# Patient Record
Sex: Male | Born: 1955 | Race: Black or African American | Hispanic: No | Marital: Single | Smoking: Current some day smoker
Health system: Southern US, Community
[De-identification: ages and names within clinical notes are randomized; demographics above are authoritative.]

## PROBLEM LIST (undated history)

## (undated) DIAGNOSIS — D696 Thrombocytopenia, unspecified: Secondary | ICD-10-CM

## (undated) DIAGNOSIS — Z789 Other specified health status: Secondary | ICD-10-CM

## (undated) HISTORY — PX: HAND SURGERY: SHX662

## (undated) HISTORY — DX: Thrombocytopenia, unspecified: D69.6

---

## 2005-03-19 ENCOUNTER — Other Ambulatory Visit: Payer: Self-pay

## 2005-03-19 ENCOUNTER — Emergency Department: Payer: Self-pay | Admitting: Unknown Physician Specialty

## 2008-05-15 ENCOUNTER — Emergency Department: Payer: Self-pay | Admitting: Emergency Medicine

## 2008-09-23 ENCOUNTER — Emergency Department (HOSPITAL_COMMUNITY): Admission: EM | Admit: 2008-09-23 | Discharge: 2008-09-23 | Payer: Self-pay | Admitting: Emergency Medicine

## 2009-03-25 ENCOUNTER — Emergency Department: Payer: Self-pay | Admitting: Emergency Medicine

## 2010-10-24 ENCOUNTER — Emergency Department: Payer: Self-pay | Admitting: *Deleted

## 2013-09-17 ENCOUNTER — Emergency Department: Payer: Self-pay | Admitting: Emergency Medicine

## 2013-09-17 LAB — COMPREHENSIVE METABOLIC PANEL
ALT: 31 U/L (ref 12–78)
ANION GAP: 10 (ref 7–16)
Albumin: 3.5 g/dL (ref 3.4–5.0)
Alkaline Phosphatase: 48 U/L
BILIRUBIN TOTAL: 0.4 mg/dL (ref 0.2–1.0)
BUN: 10 mg/dL (ref 7–18)
CREATININE: 0.88 mg/dL (ref 0.60–1.30)
Calcium, Total: 7.9 mg/dL — ABNORMAL LOW (ref 8.5–10.1)
Chloride: 108 mmol/L — ABNORMAL HIGH (ref 98–107)
Co2: 23 mmol/L (ref 21–32)
GLUCOSE: 98 mg/dL (ref 65–99)
OSMOLALITY: 280 (ref 275–301)
POTASSIUM: 3.8 mmol/L (ref 3.5–5.1)
SGOT(AST): 32 U/L (ref 15–37)
Sodium: 141 mmol/L (ref 136–145)
Total Protein: 7.2 g/dL (ref 6.4–8.2)

## 2013-09-17 LAB — PROTIME-INR
INR: 1
Prothrombin Time: 13.1 secs (ref 11.5–14.7)

## 2013-09-17 LAB — CBC
HCT: 47.3 % (ref 40.0–52.0)
HGB: 15.2 g/dL (ref 13.0–18.0)
MCH: 30.3 pg (ref 26.0–34.0)
MCHC: 32.2 g/dL (ref 32.0–36.0)
MCV: 94 fL (ref 80–100)
PLATELETS: 144 10*3/uL — AB (ref 150–440)
RBC: 5.02 10*6/uL (ref 4.40–5.90)
RDW: 14.7 % — AB (ref 11.5–14.5)
WBC: 7.7 10*3/uL (ref 3.8–10.6)

## 2015-10-17 ENCOUNTER — Emergency Department: Payer: No Typology Code available for payment source

## 2015-10-17 ENCOUNTER — Emergency Department
Admission: EM | Admit: 2015-10-17 | Discharge: 2015-10-17 | Disposition: A | Discharge status: Home / Self Care | Payer: No Typology Code available for payment source | Attending: Emergency Medicine | Admitting: Emergency Medicine

## 2015-10-17 ENCOUNTER — Encounter: Payer: Self-pay | Admitting: *Deleted

## 2015-10-17 ENCOUNTER — Other Ambulatory Visit: Payer: Self-pay | Admitting: Radiology

## 2015-10-17 DIAGNOSIS — S50812A Abrasion of left forearm, initial encounter: Secondary | ICD-10-CM | POA: Diagnosis not present

## 2015-10-17 DIAGNOSIS — F17210 Nicotine dependence, cigarettes, uncomplicated: Secondary | ICD-10-CM | POA: Insufficient documentation

## 2015-10-17 DIAGNOSIS — Y9355 Activity, bike riding: Secondary | ICD-10-CM | POA: Diagnosis not present

## 2015-10-17 DIAGNOSIS — S40212A Abrasion of left shoulder, initial encounter: Secondary | ICD-10-CM | POA: Insufficient documentation

## 2015-10-17 DIAGNOSIS — S30810A Abrasion of lower back and pelvis, initial encounter: Secondary | ICD-10-CM | POA: Diagnosis not present

## 2015-10-17 DIAGNOSIS — Y92410 Unspecified street and highway as the place of occurrence of the external cause: Secondary | ICD-10-CM | POA: Insufficient documentation

## 2015-10-17 DIAGNOSIS — M545 Low back pain: Secondary | ICD-10-CM | POA: Diagnosis present

## 2015-10-17 DIAGNOSIS — Y990 Civilian activity done for income or pay: Secondary | ICD-10-CM | POA: Insufficient documentation
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
## 2015-10-17 NOTE — ED Notes (Signed)
Patient transported to X-ray 
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
## 2015-11-10 MED ORDER — PIPERACILLIN-TAZOBACTAM 3.375 G IVPB
INTRAVENOUS | Status: AC
  Filled 2015-11-10: qty 50

## 2015-11-10 MED ORDER — VANCOMYCIN HCL IN DEXTROSE 1-5 GM/200ML-% IV SOLN
INTRAVENOUS | Status: AC
  Filled 2015-11-10: qty 200
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
## 2020-05-04 ENCOUNTER — Ambulatory Visit (LOCAL_COMMUNITY_HEALTH_CENTER): Payer: Self-pay

## 2020-05-04 ENCOUNTER — Other Ambulatory Visit: Payer: Self-pay

## 2020-05-04 DIAGNOSIS — Z111 Encounter for screening for respiratory tuberculosis: Secondary | ICD-10-CM

## 2020-05-07 ENCOUNTER — Ambulatory Visit (LOCAL_COMMUNITY_HEALTH_CENTER): Payer: Self-pay

## 2020-05-07 ENCOUNTER — Other Ambulatory Visit: Payer: Self-pay

## 2020-05-07 DIAGNOSIS — Z111 Encounter for screening for respiratory tuberculosis: Secondary | ICD-10-CM

## 2020-05-07 LAB — TB SKIN TEST
Induration: 0 mm
TB Skin Test: NEGATIVE

## 2021-08-09 DIAGNOSIS — R030 Elevated blood-pressure reading, without diagnosis of hypertension: Secondary | ICD-10-CM | POA: Diagnosis not present

## 2021-08-09 DIAGNOSIS — Z720 Tobacco use: Secondary | ICD-10-CM | POA: Diagnosis not present

## 2022-06-24 ENCOUNTER — Other Ambulatory Visit: Payer: Self-pay

## 2022-06-24 ENCOUNTER — Emergency Department
Admission: EM | Admit: 2022-06-24 | Discharge: 2022-06-24 | Disposition: A | Discharge status: Home / Self Care | Payer: Medicare HMO | Attending: Emergency Medicine | Admitting: Emergency Medicine

## 2022-06-24 DIAGNOSIS — Z20822 Contact with and (suspected) exposure to covid-19: Secondary | ICD-10-CM | POA: Insufficient documentation

## 2022-06-24 DIAGNOSIS — J029 Acute pharyngitis, unspecified: Secondary | ICD-10-CM | POA: Diagnosis present

## 2022-06-24 DIAGNOSIS — R001 Bradycardia, unspecified: Secondary | ICD-10-CM | POA: Diagnosis not present

## 2022-06-24 DIAGNOSIS — R070 Pain in throat: Secondary | ICD-10-CM | POA: Diagnosis not present

## 2022-06-24 DIAGNOSIS — J020 Streptococcal pharyngitis: Secondary | ICD-10-CM | POA: Diagnosis not present

## 2022-06-24 DIAGNOSIS — R059 Cough, unspecified: Secondary | ICD-10-CM | POA: Diagnosis not present

## 2022-06-24 LAB — GROUP A STREP BY PCR: Group A Strep by PCR: DETECTED — AB

## 2022-06-24 LAB — SARS CORONAVIRUS 2 BY RT PCR: SARS Coronavirus 2 by RT PCR: NEGATIVE

## 2022-06-24 MED ORDER — DEXAMETHASONE SODIUM PHOSPHATE 10 MG/ML IJ SOLN
10.0000 mg | Freq: Once | INTRAMUSCULAR | Status: AC
  Administered 2022-06-24: 10 mg via INTRAMUSCULAR
  Filled 2022-06-24: qty 1

## 2022-06-24 MED ORDER — KETOROLAC TROMETHAMINE 15 MG/ML IJ SOLN
15.0000 mg | Freq: Once | INTRAMUSCULAR | Status: AC
  Administered 2022-06-24: 15 mg via INTRAMUSCULAR
  Filled 2022-06-24: qty 1

## 2022-06-24 MED ORDER — AMOXICILLIN-POT CLAVULANATE 875-125 MG PO TABS: 1.0000 | ORAL_TABLET | Freq: Two times a day (BID) | ORAL | 0 refills | Status: AC

## 2022-06-24 NOTE — ED Notes (Signed)
Pt walked out of dept. RN unable to give pt his d/c papers and prescription. RN attempted to call pt to advise him about Rx. Pt did not answer.

## 2022-06-24 NOTE — ED Provider Notes (Signed)
[facility] Provider Note    Event Date/Time   First MD Initiated Contact with Patient 06/24/22 1727     (approximate)   History   Sore Throat   HPI  Spencer Berg is a 67 y.o. male who presents today for evaluation of sore throat that began today.  Patient reports that he has pain with swallowing, though he is able to swallow.  He reports that he is aware scratchy voice, though no fullness to his voice or hot potato voice.  He is unsure of any sick contacts.  He is unsure if he has had a fever.  He also reports that he has had a cough that began today as well.  He denies cigarette smoking.  He denies neck stiffness.  There are no problems to display for this patient.         Physical Exam   Triage Vital Signs: ED Triage Vitals  Enc Vitals Group     BP 06/24/22 1703 (!) 115/104     Pulse Rate 06/24/22 1703 (!) 102     Resp 06/24/22 1703 18     Temp 06/24/22 1703 100.2 F (37.9 C)     Temp Source 06/24/22 1703 Oral     SpO2 06/24/22 1703 96 %     Weight --      Height --      Head Circumference --      Peak Flow --      Pain Score 06/24/22 1701 4     Pain Loc --      Pain Edu? --      Excl. in GC? --     Most recent vital signs: Vitals:   06/24/22 1703  BP: (!) 115/104  Pulse: (!) 102  Resp: 18  Temp: 100.2 F (37.9 C)  SpO2: 96%    Physical Exam Vitals and nursing note reviewed.  Constitutional:      General: Awake and alert. No acute distress.    Appearance: Normal appearance. The patient is normal weight.  HENT:     Head: Normocephalic and atraumatic.     Mouth: Mucous membranes are moist. Uvula midline.  Posterior oropharyngeal erythema.  No tonsillar exudate.  No soft palate fluctuance.  No trismus.  Mild scratchy voice, though no hot potato voice.  No sublingual swelling.  Small left sided tender cervical lymphadenopathy.  No nuchal rigidity.  Speaking easily in complete sentences. Eyes:     General: PERRL. Normal EOMs         Right eye: No discharge.        Left eye: No discharge.     Conjunctiva/sclera: Conjunctivae normal.  Cardiovascular:     Rate and Rhythm: Normal rate and regular rhythm.     Pulses: Normal pulses.  Pulmonary:     Effort: Pulmonary effort is normal. No respiratory distress.     Breath sounds: Normal breath sounds.  Abdominal:     Abdomen is soft. There is no abdominal tenderness. No rebound or guarding. No distention. Musculoskeletal:        General: No swelling. Normal range of motion.     Cervical back: Normal range of motion and neck supple.  Skin:    General: Skin is warm and dry.     Capillary Refill: Capillary refill takes less than 2 seconds.     Findings: No rash.  Neurological:     Mental Status: The patient is awake and alert.  ED Results / Procedures / Treatments   Labs (all labs ordered are listed, but only abnormal results are displayed) Labs Reviewed  GROUP A STREP BY PCR - Abnormal; Notable for the following components:      Result Value   Group A Strep by PCR DETECTED (*)    All other components within normal limits  SARS CORONAVIRUS 2 BY RT PCR     EKG     RADIOLOGY     PROCEDURES:  Critical Care performed:   Procedures   MEDICATIONS ORDERED IN ED: Medications  ketorolac (TORADOL) 15 MG/ML injection 15 mg (15 mg Intramuscular Given 06/24/22 1738)  dexamethasone (DECADRON) injection 10 mg (10 mg Intramuscular Given 06/24/22 1738)     IMPRESSION / MDM / ASSESSMENT AND PLAN / ED COURSE  I reviewed the triage vital signs and the nursing notes.   Differential diagnosis includes, but is not limited to, strep pharyngitis, viral pharyngitis, COVID-19.  Patient presents emergency department tachycardic to 102, though normotensive.  He has a temperature of 100.2 F.  Normal oxygen saturation on room air.  There is no uvular deviation, no trismus, no hot potato voice, no drooling, not consistent with peritonsillar or retropharyngeal  abscess.  There is no neck pain or stiffness.  Patient was treated with Decadron and Toradol for his pain.  Swabs obtained are positive for strep pharyngitis.  Patient is eating crackers and drinking juice and has no trouble with p.o. intake.  He is tolerating his secretions without difficulty.  He was started on antibiotics for we discussed the importance of completing the entire antibiotic course even if he begins to feel better to prevent post streptococcal complications.  He was also advised that he is contagious to others.  Patient understands and agrees with plan.  He was discharged in stable condition.  Patient's presentation is most consistent with acute complicated illness / injury requiring diagnostic workup.   Clinical Course as of 06/24/22 1900  Tue Jun 24, 2022  4098 Patient reports that he feels significantly improved.  He is eating crackers and drinking orange juice, no trouble with p.o. intake [JP]    Clinical Course User Index [JP] Tiani Stanbery, Herb Grays, PA-C     FINAL CLINICAL IMPRESSION(S) / ED DIAGNOSES   Final diagnoses:  Strep throat     Rx / DC Orders   ED Discharge Orders          Ordered    amoxicillin-clavulanate (AUGMENTIN) 875-125 MG tablet  2 times daily        06/24/22 1839             Note:  This document was prepared using Dragon voice recognition software and may include unintentional dictation errors.   Keturah Shavers 06/24/22 1900    Chesley Noon, MD 06/24/22 2303

## 2022-06-24 NOTE — Discharge Instructions (Signed)
You were diagnosed with strep throat.  Please take the antibiotics for the full course of treatment, even if you begin to feel better.  Remember that you are contagious to others.  Please return if you develop trouble swallowing, fevers, drooling, or any other concerns.  It was a pleasure caring for you today.
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
## 2022-06-27 ENCOUNTER — Telehealth: Payer: Self-pay

## 2022-06-27 NOTE — Telephone Encounter (Signed)
        Patient  visited [facility] on 06/24/2022  for sore throat.   Telephone encounter attempt :  1st  Voicemail full unable to leave message.   [facility] Jord[facility]  [facility] Population Health Community Resource Care Guide   [email or web address]  ?? [phone]   Website: [email or web address]  [email or web address]

## 2022-07-01 ENCOUNTER — Telehealth: Payer: Self-pay

## 2022-07-01 NOTE — Telephone Encounter (Signed)
Transition Care Management Unsuccessful Follow-up Telephone Call  Date of discharge and from where:  06/24/2022 [facility].  Attempts:  2nd Attempt  Reason for unsuccessful TCM follow-up call:  Unable to reach patient   [facility]  [facility] Population Health Community Resource Care Guide   [email or web address]  ?? [phone]   Website: [email or web address]  [email or web address]

## 2023-02-05 ENCOUNTER — Emergency Department (HOSPITAL_COMMUNITY): Payer: 59

## 2023-02-05 ENCOUNTER — Inpatient Hospital Stay (HOSPITAL_COMMUNITY)
Admission: EM | Admit: 2023-02-05 | Discharge: 2023-02-12 | DRG: 522 | Disposition: A | Discharge status: Home Health | Payer: 59 | Attending: Internal Medicine | Admitting: Internal Medicine

## 2023-02-05 ENCOUNTER — Other Ambulatory Visit: Payer: Self-pay

## 2023-02-05 ENCOUNTER — Encounter (HOSPITAL_COMMUNITY): Payer: Self-pay

## 2023-02-05 DIAGNOSIS — M25551 Pain in right hip: Secondary | ICD-10-CM | POA: Diagnosis not present

## 2023-02-05 DIAGNOSIS — S72011A Unspecified intracapsular fracture of right femur, initial encounter for closed fracture: Principal | ICD-10-CM | POA: Insufficient documentation

## 2023-02-05 DIAGNOSIS — S91012A Laceration without foreign body, left ankle, initial encounter: Secondary | ICD-10-CM | POA: Diagnosis present

## 2023-02-05 DIAGNOSIS — Z96641 Presence of right artificial hip joint: Secondary | ICD-10-CM | POA: Diagnosis present

## 2023-02-05 DIAGNOSIS — F1010 Alcohol abuse, uncomplicated: Secondary | ICD-10-CM | POA: Diagnosis present

## 2023-02-05 DIAGNOSIS — E559 Vitamin D deficiency, unspecified: Secondary | ICD-10-CM | POA: Diagnosis present

## 2023-02-05 DIAGNOSIS — N281 Cyst of kidney, acquired: Secondary | ICD-10-CM | POA: Diagnosis not present

## 2023-02-05 DIAGNOSIS — Z1152 Encounter for screening for COVID-19: Secondary | ICD-10-CM

## 2023-02-05 DIAGNOSIS — M1712 Unilateral primary osteoarthritis, left knee: Secondary | ICD-10-CM | POA: Diagnosis not present

## 2023-02-05 DIAGNOSIS — S81812A Laceration without foreign body, left lower leg, initial encounter: Secondary | ICD-10-CM | POA: Diagnosis not present

## 2023-02-05 DIAGNOSIS — S72002A Fracture of unspecified part of neck of left femur, initial encounter for closed fracture: Secondary | ICD-10-CM | POA: Diagnosis not present

## 2023-02-05 DIAGNOSIS — S022XXA Fracture of nasal bones, initial encounter for closed fracture: Secondary | ICD-10-CM | POA: Diagnosis not present

## 2023-02-05 DIAGNOSIS — F17210 Nicotine dependence, cigarettes, uncomplicated: Secondary | ICD-10-CM | POA: Diagnosis not present

## 2023-02-05 DIAGNOSIS — S72001A Fracture of unspecified part of neck of right femur, initial encounter for closed fracture: Principal | ICD-10-CM

## 2023-02-05 DIAGNOSIS — R509 Fever, unspecified: Secondary | ICD-10-CM | POA: Diagnosis not present

## 2023-02-05 DIAGNOSIS — G9389 Other specified disorders of brain: Secondary | ICD-10-CM | POA: Diagnosis not present

## 2023-02-05 DIAGNOSIS — R7989 Other specified abnormal findings of blood chemistry: Secondary | ICD-10-CM | POA: Diagnosis not present

## 2023-02-05 DIAGNOSIS — S8981XA Other specified injuries of right lower leg, initial encounter: Secondary | ICD-10-CM | POA: Diagnosis not present

## 2023-02-05 DIAGNOSIS — I700 Atherosclerosis of aorta: Secondary | ICD-10-CM | POA: Diagnosis not present

## 2023-02-05 DIAGNOSIS — R011 Cardiac murmur, unspecified: Secondary | ICD-10-CM | POA: Diagnosis not present

## 2023-02-05 DIAGNOSIS — E872 Acidosis, unspecified: Secondary | ICD-10-CM | POA: Diagnosis not present

## 2023-02-05 DIAGNOSIS — D696 Thrombocytopenia, unspecified: Secondary | ICD-10-CM | POA: Diagnosis not present

## 2023-02-05 DIAGNOSIS — D6489 Other specified anemias: Secondary | ICD-10-CM | POA: Diagnosis not present

## 2023-02-05 DIAGNOSIS — Z6822 Body mass index (BMI) 22.0-22.9, adult: Secondary | ICD-10-CM

## 2023-02-05 DIAGNOSIS — M1612 Unilateral primary osteoarthritis, left hip: Secondary | ICD-10-CM | POA: Diagnosis not present

## 2023-02-05 DIAGNOSIS — M47812 Spondylosis without myelopathy or radiculopathy, cervical region: Secondary | ICD-10-CM | POA: Diagnosis not present

## 2023-02-05 DIAGNOSIS — Z041 Encounter for examination and observation following transport accident: Secondary | ICD-10-CM | POA: Diagnosis not present

## 2023-02-05 DIAGNOSIS — M160 Bilateral primary osteoarthritis of hip: Secondary | ICD-10-CM | POA: Diagnosis not present

## 2023-02-05 DIAGNOSIS — M19072 Primary osteoarthritis, left ankle and foot: Secondary | ICD-10-CM | POA: Diagnosis not present

## 2023-02-05 DIAGNOSIS — S72009A Fracture of unspecified part of neck of unspecified femur, initial encounter for closed fracture: Principal | ICD-10-CM | POA: Diagnosis present

## 2023-02-05 DIAGNOSIS — E440 Moderate protein-calorie malnutrition: Secondary | ICD-10-CM | POA: Diagnosis not present

## 2023-02-05 DIAGNOSIS — S81811A Laceration without foreign body, right lower leg, initial encounter: Secondary | ICD-10-CM | POA: Diagnosis not present

## 2023-02-05 DIAGNOSIS — Z23 Encounter for immunization: Secondary | ICD-10-CM | POA: Diagnosis not present

## 2023-02-05 DIAGNOSIS — I6782 Cerebral ischemia: Secondary | ICD-10-CM | POA: Diagnosis not present

## 2023-02-05 DIAGNOSIS — J439 Emphysema, unspecified: Secondary | ICD-10-CM | POA: Diagnosis not present

## 2023-02-05 DIAGNOSIS — Y92410 Unspecified street and highway as the place of occurrence of the external cause: Secondary | ICD-10-CM

## 2023-02-05 DIAGNOSIS — M5021 Other cervical disc displacement,  high cervical region: Secondary | ICD-10-CM | POA: Diagnosis not present

## 2023-02-05 DIAGNOSIS — S199XXA Unspecified injury of neck, initial encounter: Secondary | ICD-10-CM | POA: Diagnosis not present

## 2023-02-05 HISTORY — DX: Other specified health status: Z78.9

## 2023-02-05 LAB — CBC
HCT: 39.6 % (ref 39.0–52.0)
Hemoglobin: 12.4 g/dL — ABNORMAL LOW (ref 13.0–17.0)
MCH: 28.2 pg (ref 26.0–34.0)
MCHC: 31.3 g/dL (ref 30.0–36.0)
MCV: 90.2 fL (ref 80.0–100.0)
Platelets: 66 10*3/uL — ABNORMAL LOW (ref 150–400)
RBC: 4.39 MIL/uL (ref 4.22–5.81)
RDW: 14.2 % (ref 11.5–15.5)
WBC: 6.3 10*3/uL (ref 4.0–10.5)
nRBC: 0 % (ref 0.0–0.2)

## 2023-02-05 LAB — I-STAT CHEM 8, ED
BUN: 13 mg/dL (ref 8–23)
Calcium, Ion: 1.14 mmol/L — ABNORMAL LOW (ref 1.15–1.40)
Chloride: 104 mmol/L (ref 98–111)
Creatinine, Ser: 1.1 mg/dL (ref 0.61–1.24)
Glucose, Bld: 102 mg/dL — ABNORMAL HIGH (ref 70–99)
HCT: 41 % (ref 39.0–52.0)
Hemoglobin: 13.9 g/dL (ref 13.0–17.0)
Potassium: 3.8 mmol/L (ref 3.5–5.1)
Sodium: 140 mmol/L (ref 135–145)
TCO2: 23 mmol/L (ref 22–32)

## 2023-02-05 LAB — COMPREHENSIVE METABOLIC PANEL
ALT: 15 U/L (ref 0–44)
AST: 19 U/L (ref 15–41)
Albumin: 3.7 g/dL (ref 3.5–5.0)
Alkaline Phosphatase: 47 U/L (ref 38–126)
Anion gap: 3 — ABNORMAL LOW (ref 5–15)
BUN: 11 mg/dL (ref 8–23)
CO2: 25 mmol/L (ref 22–32)
Calcium: 8.8 mg/dL — ABNORMAL LOW (ref 8.9–10.3)
Chloride: 106 mmol/L (ref 98–111)
Creatinine, Ser: 1.09 mg/dL (ref 0.61–1.24)
GFR, Estimated: >60 mL/min (ref >60)
Glucose, Bld: 104 mg/dL — ABNORMAL HIGH (ref 70–99)
Potassium: 3.7 mmol/L (ref 3.5–5.1)
Sodium: 134 mmol/L — ABNORMAL LOW (ref 135–145)
Total Bilirubin: 0.7 mg/dL (ref <1.2)
Total Protein: 6.6 g/dL (ref 6.5–8.1)

## 2023-02-05 LAB — CBC WITH DIFFERENTIAL/PLATELET
Abs Immature Granulocytes: 0.06 10*3/uL (ref 0.00–0.07)
Basophils Absolute: 0.1 10*3/uL (ref 0.0–0.1)
Basophils Relative: 1 %
Eosinophils Absolute: 0 10*3/uL (ref 0.0–0.5)
Eosinophils Relative: 0 %
HCT: 38 % — ABNORMAL LOW (ref 39.0–52.0)
Hemoglobin: 12.2 g/dL — ABNORMAL LOW (ref 13.0–17.0)
Immature Granulocytes: 1 %
Lymphocytes Relative: 7 %
Lymphs Abs: 0.7 10*3/uL (ref 0.7–4.0)
MCH: 28.5 pg (ref 26.0–34.0)
MCHC: 32.1 g/dL (ref 30.0–36.0)
MCV: 88.8 fL (ref 80.0–100.0)
Monocytes Absolute: 0.9 10*3/uL (ref 0.1–1.0)
Monocytes Relative: 8 %
Neutro Abs: 9.3 10*3/uL — ABNORMAL HIGH (ref 1.7–7.7)
Neutrophils Relative %: 83 %
Platelets: 58 10*3/uL — ABNORMAL LOW (ref 150–400)
RBC: 4.28 MIL/uL (ref 4.22–5.81)
RDW: 14.3 % (ref 11.5–15.5)
WBC: 11 10*3/uL — ABNORMAL HIGH (ref 4.0–10.5)
nRBC: 0 % (ref 0.0–0.2)

## 2023-02-05 LAB — PHOSPHORUS: Phosphorus: 2.9 mg/dL (ref 2.5–4.6)

## 2023-02-05 LAB — ETHANOL: Alcohol, Ethyl (B): 46 mg/dL — ABNORMAL HIGH (ref <10)

## 2023-02-05 LAB — I-STAT CG4 LACTIC ACID, ED
Lactic Acid, Venous: 3.4 mmol/L (ref 0.5–1.9)
Lactic Acid, Venous: 1.2 mmol/L (ref 0.5–1.9)

## 2023-02-05 LAB — SAMPLE TO BLOOD BANK

## 2023-02-05 LAB — CK: Total CK: 513 U/L — ABNORMAL HIGH (ref 49–397)

## 2023-02-05 LAB — PROTIME-INR
INR: 1.1 (ref 0.8–1.2)
Prothrombin Time: 14.3 s (ref 11.4–15.2)

## 2023-02-05 LAB — ABO/RH: ABO/RH(D): A POS

## 2023-02-05 LAB — MAGNESIUM: Magnesium: 1.7 mg/dL (ref 1.7–2.4)

## 2023-02-05 MED ORDER — HYDROMORPHONE HCL 1 MG/ML IJ SOLN
1.0000 mg | Freq: Once | INTRAMUSCULAR | Status: AC
  Administered 2023-02-05: 1 mg via INTRAVENOUS
  Filled 2023-02-05: qty 1

## 2023-02-05 MED ORDER — TETANUS-DIPHTH-ACELL PERTUSSIS 5-2.5-18.5 LF-MCG/0.5 IM SUSY
0.5000 mL | PREFILLED_SYRINGE | Freq: Once | INTRAMUSCULAR | Status: AC
  Administered 2023-02-05: 0.5 mL via INTRAMUSCULAR
  Filled 2023-02-05: qty 0.5

## 2023-02-05 MED ORDER — FOLIC ACID 1 MG PO TABS
1.0000 mg | ORAL_TABLET | Freq: Every day | ORAL | Status: DC
Start: 2023-02-05 — End: 2023-02-12
  Administered 2023-02-05 – 2023-02-12 (×8): 1 mg via ORAL
  Filled 2023-02-05 (×8): qty 1

## 2023-02-05 MED ORDER — THIAMINE HCL 100 MG/ML IJ SOLN: 100.0000 mg | Freq: Every day | INTRAMUSCULAR | Status: DC

## 2023-02-05 MED ORDER — LORAZEPAM 2 MG/ML IJ SOLN: 1.0000 mg | INTRAMUSCULAR | Status: AC | PRN

## 2023-02-05 MED ORDER — HYDROMORPHONE HCL 1 MG/ML IJ SOLN: 0.5000 mg | INTRAMUSCULAR | Status: DC | PRN

## 2023-02-05 MED ORDER — METHOCARBAMOL 500 MG PO TABS
500.0000 mg | ORAL_TABLET | Freq: Four times a day (QID) | ORAL | Status: DC | PRN
  Administered 2023-02-05: 500 mg via ORAL
  Filled 2023-02-05 (×2): qty 1

## 2023-02-05 MED ORDER — THIAMINE MONONITRATE 100 MG PO TABS
100.0000 mg | ORAL_TABLET | Freq: Every day | ORAL | Status: DC
Start: 2023-02-05 — End: 2023-02-12
  Administered 2023-02-05 – 2023-02-12 (×8): 100 mg via ORAL
  Filled 2023-02-05 (×8): qty 1

## 2023-02-05 MED ORDER — IOHEXOL 350 MG/ML SOLN
75.0000 mL | Freq: Once | INTRAVENOUS | Status: AC | PRN
  Administered 2023-02-05: 75 mL via INTRAVENOUS

## 2023-02-05 MED ORDER — MORPHINE SULFATE (PF) 4 MG/ML IV SOLN
6.0000 mg | Freq: Once | INTRAVENOUS | Status: AC
  Administered 2023-02-05: 6 mg via INTRAVENOUS
  Filled 2023-02-05: qty 2

## 2023-02-05 MED ORDER — SODIUM CHLORIDE 0.9 % IV SOLN: INTRAVENOUS | Status: AC

## 2023-02-05 MED ORDER — FENTANYL CITRATE PF 50 MCG/ML IJ SOSY
100.0000 ug | PREFILLED_SYRINGE | Freq: Once | INTRAMUSCULAR | Status: AC
  Administered 2023-02-05: 100 ug via INTRAVENOUS
  Filled 2023-02-05: qty 2

## 2023-02-05 MED ORDER — ADULT MULTIVITAMIN W/MINERALS CH
1.0000 | ORAL_TABLET | Freq: Every day | ORAL | Status: DC
  Administered 2023-02-05 – 2023-02-12 (×8): 1 via ORAL
  Filled 2023-02-05 (×8): qty 1

## 2023-02-05 MED ORDER — METHOCARBAMOL 1000 MG/10ML IJ SOLN: 500.0000 mg | Freq: Four times a day (QID) | INTRAMUSCULAR | Status: DC | PRN

## 2023-02-05 MED ORDER — HYDROCODONE-ACETAMINOPHEN 5-325 MG PO TABS
1.0000 | ORAL_TABLET | Freq: Four times a day (QID) | ORAL | Status: DC | PRN
Start: 2023-02-05 — End: 2023-02-12
  Administered 2023-02-05 – 2023-02-06 (×3): 2 via ORAL
  Filled 2023-02-05 (×3): qty 2

## 2023-02-05 MED ORDER — LORAZEPAM 1 MG PO TABS
1.0000 mg | ORAL_TABLET | ORAL | Status: AC | PRN
Start: 2023-02-05 — End: 2023-02-08

## 2023-02-05 MED ORDER — SODIUM CHLORIDE 0.9 % IV BOLUS
1000.0000 mL | Freq: Once | INTRAVENOUS | Status: AC
  Administered 2023-02-05: 1000 mL via INTRAVENOUS

## 2023-02-05 MED ORDER — LIDOCAINE HCL (PF) 1 % IJ SOLN
5.0000 mL | Freq: Once | INTRAMUSCULAR | Status: AC
  Administered 2023-02-05: 5 mL
  Filled 2023-02-05: qty 5

## 2023-02-05 NOTE — Assessment & Plan Note (Signed)
Possibly in the setting of alcohol abuse. Monitor Defer to orthopedics if any need platelet transfusion prior to proceeding to the OR

## 2023-02-05 NOTE — Assessment & Plan Note (Signed)
Repeat and follow rehydrate
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
## 2023-02-05 NOTE — Assessment & Plan Note (Signed)
Order CIWA protocol monitor for any sign of withdrawal
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
## 2023-02-05 NOTE — Assessment & Plan Note (Signed)
-   management as per orthopedics,  plan to operate   in  a.m.   Keep nothing by mouth post midnight. Patient  not on anticoagulation or antiplatelet agents   Ordered type and screen,  order a vitamin D level Patient at baseline able to walk a flight of stairs or 100 feet    Patient denies any chest pain or shortness of breath currently and/or with exertion,   ECG showing no evidence of acute ischemia  no known history of coronary artery disease,  COPD  Liver failure CKD  Given advanced age patient is at least moderate  risk  given mild cardiac murmur will get echo If echo is severely abnormal would get cardiology consult

## 2023-02-05 NOTE — Assessment & Plan Note (Signed)
Will need to follow up imaging as an outpt or when stable
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
## 2023-02-05 NOTE — ED Notes (Signed)
Assumed care on patient , denies pain , respirations unlabored , waiting for admitting MD .
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
## 2023-02-06 ENCOUNTER — Inpatient Hospital Stay (HOSPITAL_COMMUNITY): Payer: 59

## 2023-02-06 ENCOUNTER — Encounter (HOSPITAL_COMMUNITY)
Admission: EM | Disposition: A | Discharge status: Home Health | Payer: Self-pay | Source: Home / Self Care | Attending: Family Medicine

## 2023-02-06 ENCOUNTER — Inpatient Hospital Stay (HOSPITAL_COMMUNITY): Payer: 59 | Admitting: Anesthesiology

## 2023-02-06 ENCOUNTER — Other Ambulatory Visit: Payer: Self-pay

## 2023-02-06 ENCOUNTER — Encounter (HOSPITAL_COMMUNITY): Payer: Self-pay | Admitting: Internal Medicine

## 2023-02-06 DIAGNOSIS — S72011A Unspecified intracapsular fracture of right femur, initial encounter for closed fracture: Secondary | ICD-10-CM | POA: Diagnosis not present

## 2023-02-06 DIAGNOSIS — S72001A Fracture of unspecified part of neck of right femur, initial encounter for closed fracture: Secondary | ICD-10-CM

## 2023-02-06 DIAGNOSIS — N281 Cyst of kidney, acquired: Secondary | ICD-10-CM

## 2023-02-06 DIAGNOSIS — R011 Cardiac murmur, unspecified: Secondary | ICD-10-CM | POA: Diagnosis not present

## 2023-02-06 DIAGNOSIS — R7989 Other specified abnormal findings of blood chemistry: Secondary | ICD-10-CM | POA: Diagnosis not present

## 2023-02-06 DIAGNOSIS — F1010 Alcohol abuse, uncomplicated: Secondary | ICD-10-CM | POA: Diagnosis not present

## 2023-02-06 HISTORY — PX: TOTAL HIP ARTHROPLASTY: SHX124

## 2023-02-06 LAB — POCT I-STAT, CHEM 8
BUN: 11 mg/dL (ref 8–23)
Calcium, Ion: 1.07 mmol/L — ABNORMAL LOW (ref 1.15–1.40)
Chloride: 101 mmol/L (ref 98–111)
Creatinine, Ser: 0.7 mg/dL (ref 0.61–1.24)
Glucose, Bld: 130 mg/dL — ABNORMAL HIGH (ref 70–99)
HCT: 23 % — ABNORMAL LOW (ref 39.0–52.0)
Hemoglobin: 7.8 g/dL — ABNORMAL LOW (ref 13.0–17.0)
Potassium: 3.6 mmol/L (ref 3.5–5.1)
Sodium: 137 mmol/L (ref 135–145)
TCO2: 23 mmol/L (ref 22–32)

## 2023-02-06 LAB — ECHOCARDIOGRAM COMPLETE
AR max vel: 2.09 cm2
AV Area VTI: 1.99 cm2
AV Area mean vel: 1.96 cm2
AV Mean grad: 2 mm[Hg]
AV Peak grad: 4.6 mm[Hg]
Ao pk vel: 1.07 m/s
Area-P 1/2: 4.6 cm2
Height: 74 in
S' Lateral: 3.9 cm
Weight: 2800 [oz_av]

## 2023-02-06 LAB — COMPREHENSIVE METABOLIC PANEL
ALT: 16 U/L (ref 0–44)
AST: 25 U/L (ref 15–41)
Albumin: 3.3 g/dL — ABNORMAL LOW (ref 3.5–5.0)
Alkaline Phosphatase: 40 U/L (ref 38–126)
Anion gap: 9 (ref 5–15)
BUN: 12 mg/dL (ref 8–23)
CO2: 23 mmol/L (ref 22–32)
Calcium: 8.6 mg/dL — ABNORMAL LOW (ref 8.9–10.3)
Chloride: 104 mmol/L (ref 98–111)
Creatinine, Ser: 0.86 mg/dL (ref 0.61–1.24)
GFR, Estimated: >60 mL/min (ref >60)
Glucose, Bld: 110 mg/dL — ABNORMAL HIGH (ref 70–99)
Potassium: 3.9 mmol/L (ref 3.5–5.1)
Sodium: 136 mmol/L (ref 135–145)
Total Bilirubin: 1.5 mg/dL — ABNORMAL HIGH (ref <1.2)
Total Protein: 5.9 g/dL — ABNORMAL LOW (ref 6.5–8.1)

## 2023-02-06 LAB — URINALYSIS, ROUTINE W REFLEX MICROSCOPIC
Bilirubin Urine: NEGATIVE
Glucose, UA: NEGATIVE mg/dL
Hgb urine dipstick: NEGATIVE
Ketones, ur: 5 mg/dL — AB
Leukocytes,Ua: NEGATIVE
Nitrite: NEGATIVE
Protein, ur: NEGATIVE mg/dL
Specific Gravity, Urine: 1.031 — ABNORMAL HIGH (ref 1.005–1.030)
pH: 5 (ref 5.0–8.0)

## 2023-02-06 LAB — CBC
HCT: 32.6 % — ABNORMAL LOW (ref 39.0–52.0)
Hemoglobin: 10.6 g/dL — ABNORMAL LOW (ref 13.0–17.0)
MCH: 28.5 pg (ref 26.0–34.0)
MCHC: 32.5 g/dL (ref 30.0–36.0)
MCV: 87.6 fL (ref 80.0–100.0)
Platelets: 48 10*3/uL — ABNORMAL LOW (ref 150–400)
RBC: 3.72 MIL/uL — ABNORMAL LOW (ref 4.22–5.81)
RDW: 14.4 % (ref 11.5–15.5)
WBC: 7.6 10*3/uL (ref 4.0–10.5)
nRBC: 0 % (ref 0.0–0.2)

## 2023-02-06 LAB — HIV ANTIBODY (ROUTINE TESTING W REFLEX): HIV Screen 4th Generation wRfx: NONREACTIVE

## 2023-02-06 LAB — RAPID URINE DRUG SCREEN, HOSP PERFORMED
Amphetamines: NOT DETECTED
Barbiturates: NOT DETECTED
Benzodiazepines: NOT DETECTED
Cocaine: POSITIVE — AB
Opiates: POSITIVE — AB
Tetrahydrocannabinol: NOT DETECTED

## 2023-02-06 LAB — SURGICAL PCR SCREEN
MRSA, PCR: NEGATIVE
Staphylococcus aureus: NEGATIVE

## 2023-02-06 LAB — PHOSPHORUS: Phosphorus: 3.7 mg/dL (ref 2.5–4.6)

## 2023-02-06 LAB — VITAMIN D 25 HYDROXY (VIT D DEFICIENCY, FRACTURES): Vit D, 25-Hydroxy: 17.21 ng/mL — ABNORMAL LOW (ref 30–100)

## 2023-02-06 LAB — MAGNESIUM: Magnesium: 2 mg/dL (ref 1.7–2.4)

## 2023-02-06 LAB — PREALBUMIN: Prealbumin: 10 mg/dL — ABNORMAL LOW (ref 18–38)

## 2023-02-06 SURGERY — ARTHROPLASTY, HIP, TOTAL, ANTERIOR APPROACH
Anesthesia: General | Site: Hip | Laterality: Right

## 2023-02-06 MED ORDER — ROCURONIUM BROMIDE 10 MG/ML (PF) SYRINGE
PREFILLED_SYRINGE | INTRAVENOUS | Status: AC
  Filled 2023-02-06: qty 10

## 2023-02-06 MED ORDER — DEXAMETHASONE SODIUM PHOSPHATE 10 MG/ML IJ SOLN
INTRAMUSCULAR | Status: AC
  Filled 2023-02-06: qty 1

## 2023-02-06 MED ORDER — OXYCODONE HCL 5 MG/5ML PO SOLN: 5.0000 mg | Freq: Once | ORAL | Status: DC | PRN

## 2023-02-06 MED ORDER — FENTANYL CITRATE (PF) 100 MCG/2ML IJ SOLN
25.0000 ug | INTRAMUSCULAR | Status: DC | PRN
Start: 2023-02-06 — End: 2023-02-06

## 2023-02-06 MED ORDER — ENSURE ENLIVE PO LIQD
237.0000 mL | Freq: Three times a day (TID) | ORAL | Status: DC
  Administered 2023-02-06 – 2023-02-11 (×15): 237 mL via ORAL

## 2023-02-06 MED ORDER — VITAMIN D (ERGOCALCIFEROL) 1.25 MG (50000 UNIT) PO CAPS
50000.0000 [IU] | ORAL_CAPSULE | ORAL | Status: DC
  Administered 2023-02-07: 50000 [IU] via ORAL
  Filled 2023-02-06: qty 1

## 2023-02-06 MED ORDER — LIDOCAINE HCL (CARDIAC) PF 100 MG/5ML IV SOSY
PREFILLED_SYRINGE | INTRAVENOUS | Status: DC | PRN
  Administered 2023-02-06: 100 mg via INTRAVENOUS

## 2023-02-06 MED ORDER — PHENOL 1.4 % MT LIQD: 1.0000 | OROMUCOSAL | Status: DC | PRN

## 2023-02-06 MED ORDER — ACETAMINOPHEN 325 MG PO TABS
325.0000 mg | ORAL_TABLET | Freq: Four times a day (QID) | ORAL | Status: DC | PRN
  Administered 2023-02-06 – 2023-02-12 (×5): 650 mg via ORAL
  Filled 2023-02-06 (×5): qty 2

## 2023-02-06 MED ORDER — ALUM & MAG HYDROXIDE-SIMETH 200-200-20 MG/5ML PO SUSP: 30.0000 mL | ORAL | Status: DC | PRN

## 2023-02-06 MED ORDER — SORBITOL 70 % SOLN
30.0000 mL | Freq: Every day | Status: DC | PRN
Start: 2023-02-06 — End: 2023-02-12

## 2023-02-06 MED ORDER — LACTATED RINGERS IV SOLN: INTRAVENOUS | Status: DC

## 2023-02-06 MED ORDER — CEFAZOLIN SODIUM-DEXTROSE 2-4 GM/100ML-% IV SOLN
2.0000 g | INTRAVENOUS | Status: AC
  Administered 2023-02-06: 2 g via INTRAVENOUS
  Filled 2023-02-06: qty 100

## 2023-02-06 MED ORDER — TRANEXAMIC ACID-NACL 1000-0.7 MG/100ML-% IV SOLN
1000.0000 mg | INTRAVENOUS | Status: AC
  Administered 2023-02-06: 1000 mg via INTRAVENOUS
  Filled 2023-02-06 (×2): qty 100

## 2023-02-06 MED ORDER — ACETAMINOPHEN 500 MG PO TABS
1000.0000 mg | ORAL_TABLET | Freq: Four times a day (QID) | ORAL | Status: AC
  Administered 2023-02-06 – 2023-02-07 (×3): 1000 mg via ORAL
  Filled 2023-02-06 (×3): qty 2

## 2023-02-06 MED ORDER — MENTHOL 3 MG MT LOZG: 1.0000 | LOZENGE | OROMUCOSAL | Status: DC | PRN

## 2023-02-06 MED ORDER — PHENYLEPHRINE HCL (PRESSORS) 10 MG/ML IV SOLN
INTRAVENOUS | Status: AC
  Filled 2023-02-06: qty 1

## 2023-02-06 MED ORDER — CHLORHEXIDINE GLUCONATE 4 % EX SOLN: 60.0000 mL | Freq: Once | CUTANEOUS | Status: DC

## 2023-02-06 MED ORDER — SUGAMMADEX SODIUM 200 MG/2ML IV SOLN
INTRAVENOUS | Status: DC | PRN
  Administered 2023-02-06: 200 mg via INTRAVENOUS

## 2023-02-06 MED ORDER — PROPOFOL 1000 MG/100ML IV EMUL
INTRAVENOUS | Status: AC
  Filled 2023-02-06: qty 100

## 2023-02-06 MED ORDER — SODIUM CHLORIDE 0.9 % IR SOLN
Status: DC | PRN
  Administered 2023-02-06: 3000 mL

## 2023-02-06 MED ORDER — ONDANSETRON HCL 4 MG/2ML IJ SOLN: 4.0000 mg | Freq: Four times a day (QID) | INTRAMUSCULAR | Status: DC | PRN

## 2023-02-06 MED ORDER — MAGNESIUM CITRATE PO SOLN
1.0000 | Freq: Once | ORAL | Status: DC | PRN
Start: 2023-02-06 — End: 2023-02-12

## 2023-02-06 MED ORDER — BUPIVACAINE-MELOXICAM ER 400-12 MG/14ML IJ SOLN
INTRAMUSCULAR | Status: AC
Start: 2023-02-06 — End: ?
  Filled 2023-02-06: qty 1

## 2023-02-06 MED ORDER — MIDAZOLAM HCL 2 MG/2ML IJ SOLN
INTRAMUSCULAR | Status: DC | PRN
  Administered 2023-02-06: 2 mg via INTRAVENOUS

## 2023-02-06 MED ORDER — PHENYLEPHRINE HCL-NACL 20-0.9 MG/250ML-% IV SOLN
INTRAVENOUS | Status: DC | PRN
  Administered 2023-02-06: 25 ug/min via INTRAVENOUS

## 2023-02-06 MED ORDER — RIVAROXABAN 10 MG PO TABS
10.0000 mg | ORAL_TABLET | Freq: Every day | ORAL | Status: DC
  Administered 2023-02-07 – 2023-02-10 (×4): 10 mg via ORAL
  Filled 2023-02-06 (×4): qty 1

## 2023-02-06 MED ORDER — FENTANYL CITRATE (PF) 250 MCG/5ML IJ SOLN
INTRAMUSCULAR | Status: AC
  Filled 2023-02-06: qty 5

## 2023-02-06 MED ORDER — OXYCODONE HCL 5 MG PO TABS: 5.0000 mg | ORAL_TABLET | Freq: Once | ORAL | Status: DC | PRN

## 2023-02-06 MED ORDER — ACETAMINOPHEN 10 MG/ML IV SOLN: 1000.0000 mg | Freq: Once | INTRAVENOUS | Status: DC | PRN

## 2023-02-06 MED ORDER — TRANEXAMIC ACID 1000 MG/10ML IV SOLN
INTRAVENOUS | Status: DC | PRN
  Administered 2023-02-06: 2000 mg via TOPICAL

## 2023-02-06 MED ORDER — PRONTOSAN WOUND IRRIGATION OPTIME
TOPICAL | Status: DC | PRN
  Administered 2023-02-06: 1 via TOPICAL

## 2023-02-06 MED ORDER — PHENYLEPHRINE HCL (PRESSORS) 10 MG/ML IV SOLN
INTRAVENOUS | Status: AC
Start: 2023-02-06 — End: ?
  Filled 2023-02-06: qty 1

## 2023-02-06 MED ORDER — POLYETHYLENE GLYCOL 3350 17 G PO PACK: 17.0000 g | PACK | Freq: Every day | ORAL | Status: DC | PRN

## 2023-02-06 MED ORDER — POVIDONE-IODINE 10 % EX SWAB
2.0000 | Freq: Once | CUTANEOUS | Status: AC
Start: 2023-02-06 — End: 2023-02-06
  Administered 2023-02-06: 2 via TOPICAL

## 2023-02-06 MED ORDER — ROCURONIUM BROMIDE 10 MG/ML (PF) SYRINGE
PREFILLED_SYRINGE | INTRAVENOUS | Status: DC | PRN
  Administered 2023-02-06: 10 mg via INTRAVENOUS
  Administered 2023-02-06 (×2): 20 mg via INTRAVENOUS
  Administered 2023-02-06: 50 mg via INTRAVENOUS

## 2023-02-06 MED ORDER — VANCOMYCIN HCL 1000 MG IV SOLR
INTRAVENOUS | Status: AC
  Filled 2023-02-06: qty 20

## 2023-02-06 MED ORDER — METHOCARBAMOL 500 MG PO TABS
500.0000 mg | ORAL_TABLET | Freq: Four times a day (QID) | ORAL | Status: DC | PRN
  Administered 2023-02-06 – 2023-02-07 (×2): 500 mg via ORAL
  Filled 2023-02-06: qty 1

## 2023-02-06 MED ORDER — TRANEXAMIC ACID 1000 MG/10ML IV SOLN
2000.0000 mg | INTRAVENOUS | Status: AC
  Filled 2023-02-06: qty 20

## 2023-02-06 MED ORDER — PROPOFOL 10 MG/ML IV BOLUS
INTRAVENOUS | Status: DC | PRN
  Administered 2023-02-06: 150 mg via INTRAVENOUS

## 2023-02-06 MED ORDER — HYDROMORPHONE HCL 1 MG/ML IJ SOLN: 0.5000 mg | INTRAMUSCULAR | Status: DC | PRN

## 2023-02-06 MED ORDER — FENTANYL CITRATE (PF) 250 MCG/5ML IJ SOLN
INTRAMUSCULAR | Status: DC | PRN
  Administered 2023-02-06: 50 ug via INTRAVENOUS

## 2023-02-06 MED ORDER — PHENYLEPHRINE 80 MCG/ML (10ML) SYRINGE FOR IV PUSH (FOR BLOOD PRESSURE SUPPORT)
PREFILLED_SYRINGE | INTRAVENOUS | Status: AC
  Filled 2023-02-06: qty 30

## 2023-02-06 MED ORDER — VANCOMYCIN HCL 1 G IV SOLR
INTRAVENOUS | Status: DC | PRN
  Administered 2023-02-06: 1000 mg via TOPICAL

## 2023-02-06 MED ORDER — ONDANSETRON HCL 4 MG PO TABS: 4.0000 mg | ORAL_TABLET | Freq: Four times a day (QID) | ORAL | Status: DC | PRN

## 2023-02-06 MED ORDER — OXYCODONE HCL 5 MG PO TABS
5.0000 mg | ORAL_TABLET | ORAL | Status: DC | PRN
  Administered 2023-02-07 – 2023-02-11 (×5): 10 mg via ORAL
  Administered 2023-02-12: 5 mg via ORAL
  Filled 2023-02-06 (×3): qty 2
  Filled 2023-02-06: qty 1

## 2023-02-06 MED ORDER — PROPOFOL 10 MG/ML IV BOLUS
INTRAVENOUS | Status: AC
  Filled 2023-02-06: qty 20

## 2023-02-06 MED ORDER — MAGNESIUM SULFATE IN D5W 1-5 GM/100ML-% IV SOLN
1.0000 g | Freq: Once | INTRAVENOUS | Status: AC
  Administered 2023-02-06: 1 g via INTRAVENOUS
  Filled 2023-02-06: qty 100

## 2023-02-06 MED ORDER — CHLORHEXIDINE GLUCONATE 0.12 % MT SOLN: 15.0000 mL | Freq: Once | OROMUCOSAL | Status: DC

## 2023-02-06 MED ORDER — MIDAZOLAM HCL 2 MG/2ML IJ SOLN
INTRAMUSCULAR | Status: AC
  Filled 2023-02-06: qty 2

## 2023-02-06 MED ORDER — ONDANSETRON HCL 4 MG/2ML IJ SOLN
INTRAMUSCULAR | Status: DC | PRN
  Administered 2023-02-06: 4 mg via INTRAVENOUS

## 2023-02-06 MED ORDER — AMISULPRIDE (ANTIEMETIC) 5 MG/2ML IV SOLN: 10.0000 mg | Freq: Once | INTRAVENOUS | Status: DC | PRN

## 2023-02-06 MED ORDER — DEXAMETHASONE SODIUM PHOSPHATE 10 MG/ML IJ SOLN
INTRAMUSCULAR | Status: DC | PRN
  Administered 2023-02-06: 10 mg via INTRAVENOUS

## 2023-02-06 MED ORDER — CHLORHEXIDINE GLUCONATE 0.12 % MT SOLN
OROMUCOSAL | Status: AC
  Administered 2023-02-06: 15 mL
  Filled 2023-02-06: qty 15

## 2023-02-06 MED ORDER — TRANEXAMIC ACID-NACL 1000-0.7 MG/100ML-% IV SOLN
1000.0000 mg | Freq: Once | INTRAVENOUS | Status: AC
Start: 2023-02-06 — End: 2023-02-07
  Administered 2023-02-06: 1000 mg via INTRAVENOUS
  Filled 2023-02-06: qty 100

## 2023-02-06 MED ORDER — ORAL CARE MOUTH RINSE: 15.0000 mL | Freq: Once | OROMUCOSAL | Status: DC

## 2023-02-06 MED ORDER — OXYCODONE HCL 5 MG PO TABS
10.0000 mg | ORAL_TABLET | ORAL | Status: DC | PRN
  Filled 2023-02-06 (×2): qty 2

## 2023-02-06 MED ORDER — SODIUM CHLORIDE 0.9 % IV SOLN: INTRAVENOUS | Status: DC | PRN

## 2023-02-06 MED ORDER — PHENYLEPHRINE HCL (PRESSORS) 10 MG/ML IV SOLN
INTRAVENOUS | Status: DC | PRN
  Administered 2023-02-06 (×2): 80 ug via INTRAVENOUS
  Administered 2023-02-06: 120 ug via INTRAVENOUS
  Administered 2023-02-06 (×2): 80 ug via INTRAVENOUS

## 2023-02-06 MED ORDER — ALBUMIN HUMAN 5 % IV SOLN: INTRAVENOUS | Status: DC | PRN

## 2023-02-06 MED ORDER — DOCUSATE SODIUM 100 MG PO CAPS
100.0000 mg | ORAL_CAPSULE | Freq: Two times a day (BID) | ORAL | Status: DC
  Administered 2023-02-07 – 2023-02-12 (×11): 100 mg via ORAL
  Filled 2023-02-06 (×12): qty 1

## 2023-02-06 MED ORDER — ONDANSETRON HCL 4 MG/2ML IJ SOLN
INTRAMUSCULAR | Status: AC
Start: 2023-02-06 — End: ?
  Filled 2023-02-06: qty 2

## 2023-02-06 MED ORDER — 0.9 % SODIUM CHLORIDE (POUR BTL) OPTIME
TOPICAL | Status: DC | PRN
  Administered 2023-02-06: 1000 mL

## 2023-02-06 MED ORDER — CEFAZOLIN SODIUM-DEXTROSE 2-4 GM/100ML-% IV SOLN
2.0000 g | Freq: Four times a day (QID) | INTRAVENOUS | Status: AC
  Administered 2023-02-06 – 2023-02-07 (×3): 2 g via INTRAVENOUS
  Filled 2023-02-06 (×3): qty 100

## 2023-02-06 SURGICAL SUPPLY — 59 items
BAG COUNTER SPONGE SURGICOUNT (BAG) ×1 IMPLANT
BAG DECANTER FOR FLEXI CONT (MISCELLANEOUS) ×1 IMPLANT
BLADE SAG 18X100X1.27 (BLADE) ×1 IMPLANT
COOLER ICEMAN CLASSIC (MISCELLANEOUS) IMPLANT
COVER PERINEAL POST (MISCELLANEOUS) ×1 IMPLANT
COVER SURGICAL LIGHT HANDLE (MISCELLANEOUS) ×1 IMPLANT
CUP ACET PNNCL SECTR W/GRIP 56 (Hips) IMPLANT
DERMABOND ADVANCED .7 DNX12 (GAUZE/BANDAGES/DRESSINGS) IMPLANT
DRAPE C-ARM 42X72 X-RAY (DRAPES) ×1 IMPLANT
DRAPE POUCH INSTRU U-SHP 10X18 (DRAPES) ×1 IMPLANT
DRAPE STERI IOBAN 125X83 (DRAPES) ×1 IMPLANT
DRAPE U-SHAPE 47X51 STRL (DRAPES) ×2 IMPLANT
DRSG AQUACEL AG ADV 3.5X10 (GAUZE/BANDAGES/DRESSINGS) ×1 IMPLANT
DURAPREP 26ML APPLICATOR (WOUND CARE) ×2 IMPLANT
ELECT BLADE 4.0 EZ CLEAN MEGAD (MISCELLANEOUS) ×1 IMPLANT
ELECT REM PT RETURN 9FT ADLT (ELECTROSURGICAL) ×1 IMPLANT
ELECTRODE BLDE 4.0 EZ CLN MEGD (MISCELLANEOUS) ×1 IMPLANT
ELECTRODE REM PT RTRN 9FT ADLT (ELECTROSURGICAL) ×1 IMPLANT
GLOVE BIOGEL PI IND STRL 7.0 (GLOVE) ×2 IMPLANT
GLOVE BIOGEL PI IND STRL 7.5 (GLOVE) ×5 IMPLANT
GLOVE ECLIPSE 7.0 STRL STRAW (GLOVE) ×2 IMPLANT
GLOVE SKINSENSE STRL SZ7.5 (GLOVE) ×1 IMPLANT
GLOVE SURG SYN 7.5 E (GLOVE) ×4 IMPLANT
GLOVE SURG SYN 7.5 PF PI (GLOVE) ×2 IMPLANT
GLOVE SURG UNDER POLY LF SZ7 (GLOVE) ×3 IMPLANT
GLOVE SURG UNDER POLY LF SZ7.5 (GLOVE) ×2 IMPLANT
GOWN STRL REUS W/ TWL LRG LVL3 (GOWN DISPOSABLE) IMPLANT
GOWN STRL REUS W/ TWL XL LVL3 (GOWN DISPOSABLE) ×1 IMPLANT
GOWN STRL SURGICAL XL XLNG (GOWN DISPOSABLE) ×1 IMPLANT
GOWN TOGA ZIPPER T7+ PEEL AWAY (MISCELLANEOUS) ×2 IMPLANT
HEAD CERAMIC 36 PLUS5 (Hips) IMPLANT
HOOD PEEL AWAY T7 (MISCELLANEOUS) ×1 IMPLANT
IV NS IRRIG 3000ML ARTHROMATIC (IV SOLUTION) ×1 IMPLANT
KIT BASIN OR (CUSTOM PROCEDURE TRAY) ×1 IMPLANT
MARKER SKIN DUAL TIP RULER LAB (MISCELLANEOUS) ×1 IMPLANT
NDL SPNL 18GX3.5 QUINCKE PK (NEEDLE) ×1 IMPLANT
NEEDLE SPNL 18GX3.5 QUINCKE PK (NEEDLE) ×1 IMPLANT
PACK TOTAL JOINT (CUSTOM PROCEDURE TRAY) ×1 IMPLANT
PACK UNIVERSAL I (CUSTOM PROCEDURE TRAY) ×1 IMPLANT
PAD COLD SHLDR WRAP-ON (PAD) IMPLANT
PENCIL BUTTON HOLSTER BLD 10FT (ELECTRODE) IMPLANT
PINN SECTOR W/GRIP ACE CUP 56 (Hips) ×1 IMPLANT
PINNACLE ALTRX PLUS 4 N 36X56 (Hips) IMPLANT
SCREW 6.5MMX25MM (Screw) IMPLANT
SET HNDPC FAN SPRY TIP SCT (DISPOSABLE) ×1 IMPLANT
SLEEVE BEADED CABLE (Orthopedic Implant) IMPLANT
SOLUTION PRONTOSAN WOUND 350ML (IRRIGATION / IRRIGATOR) ×1 IMPLANT
STAPLER VISISTAT 35W (STAPLE) IMPLANT
STEM FEMORAL SZ6 HIGH ACTIS (Stem) IMPLANT
SUT ETHIBOND 2 V 37 (SUTURE) ×1 IMPLANT
SUT ETHILON 2 0 FS 18 (SUTURE) IMPLANT
SUT VIC AB 0 CT1 27XBRD ANBCTR (SUTURE) ×1 IMPLANT
SUT VIC AB 1 CTX36XBRD ANBCTR (SUTURE) ×1 IMPLANT
SUT VIC AB 2-0 CT1 TAPERPNT 27 (SUTURE) ×2 IMPLANT
SYR 50ML LL SCALE MARK (SYRINGE) ×1 IMPLANT
TOWEL GREEN STERILE (TOWEL DISPOSABLE) ×1 IMPLANT
TRAY FOLEY W/BAG SLVR 16FR ST (SET/KITS/TRAYS/PACK) IMPLANT
TUBE SUCT ARGYLE STRL (TUBING) ×1 IMPLANT
YANKAUER SUCT BULB TIP NO VENT (SUCTIONS) ×1 IMPLANT
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
## 2023-02-06 NOTE — H&P (Signed)
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
## 2023-02-06 NOTE — Plan of Care (Signed)
  Problem: Education: Goal: Knowledge of General Education information will improve Description: Including pain rating scale, medication(s)/side effects and non-pharmacologic comfort measures Outcome: Progressing   Problem: Clinical Measurements: Goal: Cardiovascular complication will be avoided Outcome: Progressing   Problem: Activity: Goal: Risk for activity intolerance will decrease Outcome: Progressing   Problem: Coping: Goal: Level of anxiety will decrease Outcome: Progressing   Problem: Pain Management: Goal: General experience of comfort will improve Outcome: Progressing   Problem: Safety: Goal: Ability to remain free from injury will improve Outcome: Progressing

## 2023-02-06 NOTE — Transfer of Care (Signed)
Immediate Anesthesia Transfer of Care Note  Patient: Spencer Berg  Procedure(s) Performed: TOTAL HIP ARTHROPLASTY ANTERIOR APPROACH (Right: Hip)  Patient Location: PACU  Anesthesia Type:General  Level of Consciousness: drowsy and responds to stimulation  Airway & Oxygen Therapy: Patient Spontanous Breathing  Post-op Assessment: Report given to RN and Post -op Vital signs reviewed and stable  Post vital signs: Reviewed and stable  Last Vitals:  Vitals Value Taken Time  BP 148/90 02/06/23 1605  Temp    Pulse 79 02/06/23 1605  Resp 12 02/06/23 1605  SpO2 100 % 02/06/23 1605    Last Pain:  Vitals:   02/06/23 1157  TempSrc: Oral  PainSc: 8       Patients Stated Pain Goal: 0 (02/05/23 2311)  Complications: No notable events documented.

## 2023-02-06 NOTE — Progress Notes (Signed)
*  PRELIMINARY RESULTS* Echocardiogram 2D Echocardiogram has been performed.  Spencer Berg 02/06/2023, 9:15 AM

## 2023-02-06 NOTE — Anesthesia Procedure Notes (Signed)
Procedure Name: Intubation Date/Time: 02/06/2023 1:31 PM  Performed by: Yolonda Kida, CRNAPre-anesthesia Checklist: Patient identified, Emergency Drugs available, Suction available and Patient being monitored Patient Re-evaluated:Patient Re-evaluated prior to induction Oxygen Delivery Method: Circle System Utilized Preoxygenation: Pre-oxygenation with 100% oxygen Induction Type: IV induction Ventilation: Mask ventilation without difficulty Laryngoscope Size: Mac and 4 Grade View: Grade I Tube type: Oral Tube size: 7.5 mm Number of attempts: 1 Airway Equipment and Method: Stylet and Oral airway Placement Confirmation: ETT inserted through vocal cords under direct vision, positive ETCO2 and breath sounds checked- equal and bilateral Secured at: 22 cm Tube secured with: Tape Dental Injury: Teeth and Oropharynx as per pre-operative assessment

## 2023-02-06 NOTE — Anesthesia Preprocedure Evaluation (Addendum)
Anesthesia Evaluation  Patient identified by MRN, date of birth, ID band Patient awake    Reviewed: Allergy & Precautions, NPO status , Patient's Chart, lab work & pertinent test results  History of Anesthesia Complications Negative for: history of anesthetic complications  Airway Mallampati: II  TM Distance: >3 FB Neck ROM: Full    Dental  (+) Edentulous Upper, Edentulous Lower, Dental Advisory Given   Pulmonary neg shortness of breath, neg sleep apnea, neg COPD, neg recent URI, Current Smoker   Pulmonary exam normal breath sounds clear to auscultation       Cardiovascular negative cardio ROS  Rhythm:Regular Rate:Normal  TTE 02/06/2023: IMPRESSIONS    1. Left ventricular ejection fraction, by estimation, is 55 to 60%. The  left ventricle has normal function. The left ventricle has no regional  wall motion abnormalities. Left ventricular diastolic parameters were  normal.   2. Right ventricular systolic function is normal. The right ventricular  size is normal. Tricuspid regurgitation signal is inadequate for assessing  PA pressure.   3. The mitral valve is grossly normal. No evidence of mitral valve  regurgitation.   4. The aortic valve was not well visualized. Aortic valve regurgitation  is not visualized.   5. The inferior vena cava is normal in size with <50% respiratory  variability, suggesting right atrial pressure of 8 mmHg.     Neuro/Psych negative neurological ROS     GI/Hepatic negative GI ROS,,,(+)     substance abuse  alcohol use  Endo/Other  negative endocrine ROS    Renal/GU Renal disease (right renal cyst)     Musculoskeletal   Abdominal   Peds  Hematology  (+) Blood dyscrasia (thrombocytopenia, plt 48), anemia Lab Results      Component                Value               Date                      WBC                      7.6                 02/06/2023                HGB                       10.6 (L)            02/06/2023                HCT                      32.6 (L)            02/06/2023                MCV                      87.6                02/06/2023                PLT                      48 (L)  02/06/2023              Anesthesia Other Findings   Reproductive/Obstetrics                             Anesthesia Physical Anesthesia Plan  ASA: 3  Anesthesia Plan: General   Post-op Pain Management:    Induction: Intravenous  PONV Risk Score and Plan: 1 and Ondansetron, Dexamethasone and Treatment may vary due to age or medical condition  Airway Management Planned: Oral ETT  Additional Equipment:   Intra-op Plan:   Post-operative Plan: Extubation in OR  Informed Consent:      Dental advisory given  Plan Discussed with: CRNA and Anesthesiologist  Anesthesia Plan Comments: (Risks of general anesthesia discussed including, but not limited to, sore throat, hoarse voice, chipped/damaged teeth, injury to vocal cords, nausea and vomiting, allergic reactions, lung infection, heart attack, stroke, and death. All questions answered. )       Anesthesia Quick Evaluation

## 2023-02-07 DIAGNOSIS — R7989 Other specified abnormal findings of blood chemistry: Secondary | ICD-10-CM | POA: Diagnosis not present

## 2023-02-07 DIAGNOSIS — N281 Cyst of kidney, acquired: Secondary | ICD-10-CM | POA: Diagnosis not present

## 2023-02-07 DIAGNOSIS — E440 Moderate protein-calorie malnutrition: Secondary | ICD-10-CM | POA: Insufficient documentation

## 2023-02-07 DIAGNOSIS — S72011A Unspecified intracapsular fracture of right femur, initial encounter for closed fracture: Secondary | ICD-10-CM | POA: Diagnosis not present

## 2023-02-07 DIAGNOSIS — F1010 Alcohol abuse, uncomplicated: Secondary | ICD-10-CM | POA: Diagnosis not present

## 2023-02-07 LAB — CBC
HCT: 24.7 % — ABNORMAL LOW (ref 39.0–52.0)
Hemoglobin: 8.2 g/dL — ABNORMAL LOW (ref 13.0–17.0)
MCH: 29.4 pg (ref 26.0–34.0)
MCHC: 33.2 g/dL (ref 30.0–36.0)
MCV: 88.5 fL (ref 80.0–100.0)
Platelets: 49 10*3/uL — ABNORMAL LOW (ref 150–400)
RBC: 2.79 MIL/uL — ABNORMAL LOW (ref 4.22–5.81)
RDW: 14.1 % (ref 11.5–15.5)
WBC: 10.1 10*3/uL (ref 4.0–10.5)
nRBC: 0 % (ref 0.0–0.2)

## 2023-02-07 LAB — BPAM PLATELET PHERESIS
Blood Product Expiration Date: 202412072359
ISSUE DATE / TIME: 202412061416
Unit Type and Rh: 6200

## 2023-02-07 LAB — BASIC METABOLIC PANEL
Anion gap: 8 (ref 5–15)
BUN: 14 mg/dL (ref 8–23)
CO2: 24 mmol/L (ref 22–32)
Calcium: 8.2 mg/dL — ABNORMAL LOW (ref 8.9–10.3)
Chloride: 100 mmol/L (ref 98–111)
Creatinine, Ser: 0.82 mg/dL (ref 0.61–1.24)
GFR, Estimated: >60 mL/min (ref >60)
Glucose, Bld: 128 mg/dL — ABNORMAL HIGH (ref 70–99)
Potassium: 3.9 mmol/L (ref 3.5–5.1)
Sodium: 132 mmol/L — ABNORMAL LOW (ref 135–145)

## 2023-02-07 LAB — PREPARE PLATELET PHERESIS: Unit division: 0
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
## 2023-02-07 NOTE — Progress Notes (Signed)
Patient is doing well this morning. His pain is under control. He is ready to do physical therapy. He states that he does not have transportation back home to [place] and that his fianc does not drive. This may create a social dilemma. May need short term SNF. Patient is stable from an orthopedic standpoint. Mobilize with PT. Begin Xarelto for DVT prophylaxis. Follow up in two weeks for suture removal.

## 2023-02-07 NOTE — Anesthesia Postprocedure Evaluation (Signed)
Anesthesia Post Note  Patient: Spencer Berg  Procedure(s) Performed: TOTAL HIP ARTHROPLASTY ANTERIOR APPROACH (Right: Hip)     Patient location during evaluation: PACU Anesthesia Type: General Level of consciousness: awake and alert Pain management: pain level controlled Vital Signs Assessment: post-procedure vital signs reviewed and stable Respiratory status: spontaneous breathing, nonlabored ventilation, respiratory function stable and patient connected to nasal cannula oxygen Cardiovascular status: blood pressure returned to baseline and stable Postop Assessment: no apparent nausea or vomiting Anesthetic complications: no   No notable events documented.  Last Vitals:  Vitals:   02/06/23 2345 02/07/23 0456  BP: 122/72 118/80  Pulse: 82 73  Resp: 17 17  Temp: 36.7 C 37.5 C  SpO2: 99% 99%    Last Pain:  Vitals:   02/07/23 0936  TempSrc:   PainSc: 6                  Beryle Lathe
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
## 2023-02-08 DIAGNOSIS — N281 Cyst of kidney, acquired: Secondary | ICD-10-CM | POA: Diagnosis not present

## 2023-02-08 DIAGNOSIS — R7989 Other specified abnormal findings of blood chemistry: Secondary | ICD-10-CM | POA: Diagnosis not present

## 2023-02-08 DIAGNOSIS — F1010 Alcohol abuse, uncomplicated: Secondary | ICD-10-CM | POA: Diagnosis not present

## 2023-02-08 DIAGNOSIS — S72011A Unspecified intracapsular fracture of right femur, initial encounter for closed fracture: Secondary | ICD-10-CM | POA: Diagnosis not present

## 2023-02-08 LAB — BASIC METABOLIC PANEL
Anion gap: 7 (ref 5–15)
BUN: 12 mg/dL (ref 8–23)
CO2: 28 mmol/L (ref 22–32)
Calcium: 8.2 mg/dL — ABNORMAL LOW (ref 8.9–10.3)
Chloride: 99 mmol/L (ref 98–111)
Creatinine, Ser: 0.87 mg/dL (ref 0.61–1.24)
GFR, Estimated: >60 mL/min (ref >60)
Glucose, Bld: 122 mg/dL — ABNORMAL HIGH (ref 70–99)
Potassium: 3.8 mmol/L (ref 3.5–5.1)
Sodium: 134 mmol/L — ABNORMAL LOW (ref 135–145)

## 2023-02-08 LAB — CBC
HCT: 19.5 % — ABNORMAL LOW (ref 39.0–52.0)
Hemoglobin: 6.6 g/dL — CL (ref 13.0–17.0)
MCH: 29.7 pg (ref 26.0–34.0)
MCHC: 33.8 g/dL (ref 30.0–36.0)
MCV: 87.8 fL (ref 80.0–100.0)
Platelets: 44 10*3/uL — ABNORMAL LOW (ref 150–400)
RBC: 2.22 MIL/uL — ABNORMAL LOW (ref 4.22–5.81)
RDW: 14 % (ref 11.5–15.5)
WBC: 9.2 10*3/uL (ref 4.0–10.5)
nRBC: 0 % (ref 0.0–0.2)

## 2023-02-08 LAB — PREPARE RBC (CROSSMATCH)

## 2023-02-08 MED ORDER — SODIUM CHLORIDE 0.9% IV SOLUTION: Freq: Once | INTRAVENOUS | Status: DC

## 2023-02-08 MED ORDER — RIVAROXABAN 10 MG PO TABS: 10.0000 mg | ORAL_TABLET | Freq: Every day | ORAL | 0 refills | Status: AC

## 2023-02-08 MED ORDER — OXYCODONE HCL 5 MG PO TABS: 5.0000 mg | ORAL_TABLET | Freq: Four times a day (QID) | ORAL | 0 refills | Status: AC | PRN

## 2023-02-08 NOTE — Discharge Instructions (Signed)
    1. Change dressings as needed 2. May shower but keep incisions covered and dry 3. Take your prescribed blood thinner to prevent blood clots 4. Take stool softeners as needed 5. Take pain meds as needed  I have reviewed the patient's history and given the presence of a fragility fracture, I have deemed the necessity of a osteoporosis management referral or confirmed that the patient is currently enrolled in a osteoporosis treatment program.
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
## 2023-02-08 NOTE — Progress Notes (Signed)
   02/08/23 0744  Assess: MEWS Score  Temp (!) 100.5 F (38.1 C)  BP 100/62  MAP (mmHg) 71  Pulse Rate 96  Resp 17  SpO2 100 %  Assess: MEWS Score  MEWS Temp 1  MEWS Systolic 1  MEWS Pulse 0  MEWS RR 0  MEWS LOC 0  MEWS Score 2  MEWS Score Color Yellow  Assess: if the MEWS score is Yellow or Red  Were vital signs accurate and taken at a resting state? Yes  Does the patient meet 2 or more of the SIRS criteria? No  MEWS guidelines implemented  Yes, yellow  Treat  MEWS Interventions Considered administering scheduled or prn medications/treatments as ordered  Take Vital Signs  Increase Vital Sign Frequency  Yellow: Q2hr x1, continue Q4hrs until patient remains green for 12hrs  Escalate  MEWS: Escalate Yellow: Discuss with charge nurse and consider notifying provider and/or RRT  Notify: Charge Nurse/RN  Name of Charge Nurse/RN Notified Lynden Ang, RN  Provider Notification  Provider Name/Title Lama  Date Provider Notified 02/08/23  Time Provider Notified 0800  Method of Notification Face-to-face;Page  Notification Reason Other (Comment) (yellow mews)  Provider response See new orders  Date of Provider Response 02/08/23  Time of Provider Response 0800  Assess: SIRS CRITERIA  SIRS Temperature  0  SIRS Pulse 1  SIRS Respirations  0  SIRS WBC 0  SIRS Score Sum  1

## 2023-02-08 NOTE — Progress Notes (Signed)
Subjective: 2 Days Post-Op Procedure(s) (LRB): TOTAL HIP ARTHROPLASTY ANTERIOR APPROACH (Right) Patient reports pain as moderate.  Feeling lightheaded this am.  Currently receiving blood as hemoglobin was 6.6 this am.   Objective: Vital signs in last 24 hours: Temp:  [98.1 F (36.7 C)-100.5 F (38.1 C)] 99.1 F (37.3 C) (12/08 0924) Pulse Rate:  [85-96] 87 (12/08 0924) Resp:  [15-18] 18 (12/08 0924) BP: (93-124)/(53-73) 98/59 (12/08 0924) SpO2:  [98 %-100 %] 100 % (12/08 0924)  Intake/Output from previous day: 12/07 0701 - 12/08 0700 In: -  Out: 800 [Urine:800] Intake/Output this shift: No intake/output data recorded.  Recent Labs    02/05/23 2118 02/06/23 0543 02/06/23 1524 02/07/23 0403 02/08/23 0609  HGB 12.2* 10.6* 7.8* 8.2* 6.6*   Recent Labs    02/07/23 0403 02/08/23 0609  WBC 10.1 9.2  RBC 2.79* 2.22*  HCT 24.7* 19.5*  PLT 49* 44*   Recent Labs    02/07/23 0403 02/08/23 0609  NA 132* 134*  K 3.9 3.8  CL 100 99  CO2 24 28  BUN 14 12  CREATININE 0.82 0.87  GLUCOSE 128* 122*  CALCIUM 8.2* 8.2*   Recent Labs    02/05/23 1657  INR 1.1    Neurologically intact Neurovascular intact Sensation intact distally Intact pulses distally Dorsiflexion/Plantar flexion intact Incision: moderate drainage No cellulitis present Compartment soft   Assessment/Plan: 2 Days Post-Op Procedure(s) (LRB): TOTAL HIP ARTHROPLASTY ANTERIOR APPROACH (Right) Up with therapy Weightbearing: WBAT RLE Insicional and dressing care: PLEASE CHANGE AQUACEL PRIOR TO DISCHARGE Orthopedic device(s): None Showering: POD #3 with aquacel VTE prophylaxis:  xarelto   x 4 weeks Pain control: oxycodone Follow - up plan: 2 weeks Contact information: xu MD  Dub Mikes PA D/c pain meds/xarelto on chart      Cristie Hem 02/08/2023, 10:26 AM

## 2023-02-09 ENCOUNTER — Encounter (HOSPITAL_COMMUNITY): Payer: Self-pay | Admitting: Orthopaedic Surgery

## 2023-02-09 ENCOUNTER — Inpatient Hospital Stay (HOSPITAL_COMMUNITY): Payer: 59

## 2023-02-09 DIAGNOSIS — N281 Cyst of kidney, acquired: Secondary | ICD-10-CM | POA: Diagnosis not present

## 2023-02-09 DIAGNOSIS — S72011A Unspecified intracapsular fracture of right femur, initial encounter for closed fracture: Secondary | ICD-10-CM | POA: Diagnosis not present

## 2023-02-09 DIAGNOSIS — R7989 Other specified abnormal findings of blood chemistry: Secondary | ICD-10-CM | POA: Diagnosis not present

## 2023-02-09 DIAGNOSIS — F1010 Alcohol abuse, uncomplicated: Secondary | ICD-10-CM | POA: Diagnosis not present

## 2023-02-09 LAB — TYPE AND SCREEN
ABO/RH(D): A POS
Antibody Screen: NEGATIVE
Unit division: 0

## 2023-02-09 LAB — CBC
HCT: 21.8 % — ABNORMAL LOW (ref 39.0–52.0)
Hemoglobin: 7.5 g/dL — ABNORMAL LOW (ref 13.0–17.0)
MCH: 30.2 pg (ref 26.0–34.0)
MCHC: 34.4 g/dL (ref 30.0–36.0)
MCV: 87.9 fL (ref 80.0–100.0)
Platelets: 46 10*3/uL — ABNORMAL LOW (ref 150–400)
RBC: 2.48 MIL/uL — ABNORMAL LOW (ref 4.22–5.81)
RDW: 13.6 % (ref 11.5–15.5)
WBC: 8.5 10*3/uL (ref 4.0–10.5)
nRBC: 0 % (ref 0.0–0.2)

## 2023-02-09 LAB — BPAM RBC
Blood Product Expiration Date: 202412282359
ISSUE DATE / TIME: 202412080856
Unit Type and Rh: 6200

## 2023-02-09 LAB — VITAMIN B1: Vitamin B1 (Thiamine): 113 nmol/L (ref 66.5–200.0)
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
## 2023-02-09 NOTE — Care Management Important Message (Signed)
Important Message  Patient Details  Name: DEVEREAUX STALLS MRN: [id number] Date of Birth: 1955/08/13   Important Message Given:  Yes - Medicare IM     Sherilyn Banker 02/09/2023, 4:14 PM

## 2023-02-09 NOTE — Progress Notes (Signed)
Patient is stable.  Sitting up in recliner.   Low grade fever likely post surgical.  I will order incentive spirometer. Surgical bandage shows small bloody drainage.  Thigh soft. F/u in office in 2 weeks.
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
## 2023-02-09 NOTE — Plan of Care (Signed)
  Problem: Clinical Measurements: Goal: Will remain free from infection Outcome: Progressing   Problem: Activity: Goal: Risk for activity intolerance will decrease Outcome: Progressing   Problem: Nutrition: Goal: Adequate nutrition will be maintained Outcome: Progressing   Problem: Pain Management: Goal: General experience of comfort will improve Outcome: Progressing   Problem: Safety: Goal: Ability to remain free from injury will improve Outcome: Progressing   Problem: Skin Integrity: Goal: Risk for impaired skin integrity will decrease Outcome: Progressing   Problem: Pain Management: Goal: Pain level will decrease Outcome: Progressing

## 2023-02-10 DIAGNOSIS — S72011A Unspecified intracapsular fracture of right femur, initial encounter for closed fracture: Secondary | ICD-10-CM | POA: Diagnosis not present

## 2023-02-10 DIAGNOSIS — F1010 Alcohol abuse, uncomplicated: Secondary | ICD-10-CM | POA: Diagnosis not present

## 2023-02-10 DIAGNOSIS — R7989 Other specified abnormal findings of blood chemistry: Secondary | ICD-10-CM | POA: Diagnosis not present

## 2023-02-10 DIAGNOSIS — N281 Cyst of kidney, acquired: Secondary | ICD-10-CM | POA: Diagnosis not present

## 2023-02-10 LAB — PREPARE RBC (CROSSMATCH)

## 2023-02-10 LAB — COMPREHENSIVE METABOLIC PANEL
ALT: 18 U/L (ref 0–44)
AST: 38 U/L (ref 15–41)
Albumin: 2.5 g/dL — ABNORMAL LOW (ref 3.5–5.0)
Alkaline Phosphatase: 31 U/L — ABNORMAL LOW (ref 38–126)
Anion gap: 7 (ref 5–15)
BUN: 14 mg/dL (ref 8–23)
CO2: 30 mmol/L (ref 22–32)
Calcium: 8.4 mg/dL — ABNORMAL LOW (ref 8.9–10.3)
Chloride: 99 mmol/L (ref 98–111)
Creatinine, Ser: 0.9 mg/dL (ref 0.61–1.24)
GFR, Estimated: >60 mL/min (ref >60)
Glucose, Bld: 114 mg/dL — ABNORMAL HIGH (ref 70–99)
Potassium: 3.8 mmol/L (ref 3.5–5.1)
Sodium: 136 mmol/L (ref 135–145)
Total Bilirubin: 1.2 mg/dL — ABNORMAL HIGH (ref <1.2)
Total Protein: 5.3 g/dL — ABNORMAL LOW (ref 6.5–8.1)

## 2023-02-10 LAB — URINALYSIS, ROUTINE W REFLEX MICROSCOPIC
Bilirubin Urine: NEGATIVE
Glucose, UA: NEGATIVE mg/dL
Hgb urine dipstick: NEGATIVE
Ketones, ur: NEGATIVE mg/dL
Leukocytes,Ua: NEGATIVE
Nitrite: NEGATIVE
Protein, ur: NEGATIVE mg/dL
Specific Gravity, Urine: 1.016 (ref 1.005–1.030)
pH: 7 (ref 5.0–8.0)

## 2023-02-10 LAB — CBC
HCT: 20 % — ABNORMAL LOW (ref 39.0–52.0)
Hemoglobin: 6.7 g/dL — CL (ref 13.0–17.0)
MCH: 29.6 pg (ref 26.0–34.0)
MCHC: 33.5 g/dL (ref 30.0–36.0)
MCV: 88.5 fL (ref 80.0–100.0)
Platelets: 62 10*3/uL — ABNORMAL LOW (ref 150–400)
RBC: 2.26 MIL/uL — ABNORMAL LOW (ref 4.22–5.81)
RDW: 13.8 % (ref 11.5–15.5)
WBC: 8.1 10*3/uL (ref 4.0–10.5)
nRBC: 0 % (ref 0.0–0.2)

## 2023-02-10 MED ORDER — SODIUM CHLORIDE 0.9% IV SOLUTION
Freq: Once | INTRAVENOUS | Status: AC
Start: 2023-02-10 — End: 2023-02-10

## 2023-02-10 MED ORDER — SODIUM CHLORIDE 0.9% IV SOLUTION: Freq: Once | INTRAVENOUS | Status: AC

## 2023-02-10 NOTE — Plan of Care (Signed)
  Problem: Education: Goal: Knowledge of General Education information will improve Description: Including pain rating scale, medication(s)/side effects and non-pharmacologic comfort measures Outcome: Progressing   Problem: Activity: Goal: Risk for activity intolerance will decrease Outcome: Progressing   Problem: Nutrition: Goal: Adequate nutrition will be maintained Outcome: Progressing   Problem: Elimination: Goal: Will not experience complications related to bowel motility Outcome: Progressing Goal: Will not experience complications related to urinary retention Outcome: Progressing   Problem: Pain Management: Goal: General experience of comfort will improve Outcome: Progressing   Problem: Safety: Goal: Ability to remain free from injury will improve Outcome: Progressing

## 2023-02-10 NOTE — Plan of Care (Signed)
  Problem: Clinical Measurements: Goal: Ability to maintain clinical measurements within normal limits will improve Outcome: Progressing Goal: Will remain free from infection Outcome: Progressing Goal: Respiratory complications will improve Outcome: Progressing   Problem: Activity: Goal: Risk for activity intolerance will decrease Outcome: Progressing   Problem: Nutrition: Goal: Adequate nutrition will be maintained Outcome: Progressing   Problem: Pain Management: Goal: General experience of comfort will improve Outcome: Progressing   Problem: Safety: Goal: Ability to remain free from injury will improve Outcome: Progressing   Problem: Skin Integrity: Goal: Risk for impaired skin integrity will decrease Outcome: Progressing
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
## 2023-02-10 NOTE — Progress Notes (Signed)
Date and time results received: 02/10/23 0612  Reported by: Leonides Schanz, Lab Reported to: Pamelia Hoit, RN   Test: Hemoglobin  Critical Value: 6.7  Name of Provider Notified: Johann Capers  Orders Received? Or Actions Taken? Obtain Type & screen, Transfuse 1 unit RBC

## 2023-02-11 DIAGNOSIS — S72011A Unspecified intracapsular fracture of right femur, initial encounter for closed fracture: Secondary | ICD-10-CM | POA: Diagnosis not present

## 2023-02-11 LAB — COMPREHENSIVE METABOLIC PANEL
ALT: 28 U/L (ref 0–44)
AST: 44 U/L — ABNORMAL HIGH (ref 15–41)
Albumin: 2.5 g/dL — ABNORMAL LOW (ref 3.5–5.0)
Alkaline Phosphatase: 33 U/L — ABNORMAL LOW (ref 38–126)
Anion gap: 4 — ABNORMAL LOW (ref 5–15)
BUN: 15 mg/dL (ref 8–23)
CO2: 28 mmol/L (ref 22–32)
Calcium: 8.2 mg/dL — ABNORMAL LOW (ref 8.9–10.3)
Chloride: 100 mmol/L (ref 98–111)
Creatinine, Ser: 0.77 mg/dL (ref 0.61–1.24)
GFR, Estimated: >60 mL/min (ref >60)
Glucose, Bld: 110 mg/dL — ABNORMAL HIGH (ref 70–99)
Potassium: 4 mmol/L (ref 3.5–5.1)
Sodium: 132 mmol/L — ABNORMAL LOW (ref 135–145)
Total Bilirubin: 1.1 mg/dL (ref <1.2)
Total Protein: 5.5 g/dL — ABNORMAL LOW (ref 6.5–8.1)

## 2023-02-11 LAB — URINE CULTURE: Culture: NO GROWTH

## 2023-02-11 LAB — TYPE AND SCREEN
ABO/RH(D): A POS
Antibody Screen: NEGATIVE
Unit division: 0

## 2023-02-11 LAB — CBC
HCT: 24 % — ABNORMAL LOW (ref 39.0–52.0)
Hemoglobin: 8.2 g/dL — ABNORMAL LOW (ref 13.0–17.0)
MCH: 30.6 pg (ref 26.0–34.0)
MCHC: 34.2 g/dL (ref 30.0–36.0)
MCV: 89.6 fL (ref 80.0–100.0)
Platelets: 109 10*3/uL — ABNORMAL LOW (ref 150–400)
RBC: 2.68 MIL/uL — ABNORMAL LOW (ref 4.22–5.81)
RDW: 14.1 % (ref 11.5–15.5)
WBC: 7.8 10*3/uL (ref 4.0–10.5)
nRBC: 0 % (ref 0.0–0.2)

## 2023-02-11 LAB — BPAM PLATELET PHERESIS
Blood Product Expiration Date: 202412112359
ISSUE DATE / TIME: 202412100949
Unit Type and Rh: 6200

## 2023-02-11 LAB — PREPARE PLATELET PHERESIS: Unit division: 0

## 2023-02-11 LAB — BPAM RBC
Blood Product Expiration Date: 202412312359
ISSUE DATE / TIME: 202412101142
Unit Type and Rh: 6200
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
## 2023-02-11 NOTE — Plan of Care (Signed)
  Problem: Clinical Measurements: Goal: Ability to maintain clinical measurements within normal limits will improve Outcome: Progressing Goal: Will remain free from infection Outcome: Progressing Goal: Respiratory complications will improve Outcome: Progressing   Problem: Nutrition: Goal: Adequate nutrition will be maintained Outcome: Progressing   Problem: Pain Management: Goal: General experience of comfort will improve Outcome: Progressing   Problem: Skin Integrity: Goal: Risk for impaired skin integrity will decrease Outcome: Progressing

## 2023-02-11 NOTE — Plan of Care (Signed)
  Problem: Education: Goal: Knowledge of General Education information will improve Description: Including pain rating scale, medication(s)/side effects and non-pharmacologic comfort measures Outcome: Progressing   Problem: Activity: Goal: Risk for activity intolerance will decrease Outcome: Progressing   Problem: Pain Management: Goal: General experience of comfort will improve Outcome: Progressing   Problem: Safety: Goal: Ability to remain free from injury will improve Outcome: Progressing
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
## 2023-02-11 NOTE — Progress Notes (Signed)
Transition of Care [facility]) - CAGE-AID Screening   Patient Details  Name: Spencer Berg MRN: [id number] Date of Birth: 12-07-1955  [facility]) CM/SW Contact:    Katha Hamming, RN Phone Number: 02/11/2023, 8:19 PM   Clinical Narrative:  PT reports he "used to" use drugs/alcohol. ITSS also completed, see flowsheet.  CAGE-AID Screening:    Have You Ever Felt You Ought to Cut Down on Your Drinking or Drug Use?: Yes Have People Annoyed You By Critizing Your Drinking Or Drug Use?: No Have You Felt Bad Or Guilty About Your Drinking Or Drug Use?: No Have You Ever Had a Drink or Used Drugs First Thing In The Morning to Steady Your Nerves or to Get Rid of a Hangover?: No CAGE-AID Score: 1

## 2023-02-12 DIAGNOSIS — S72011A Unspecified intracapsular fracture of right femur, initial encounter for closed fracture: Secondary | ICD-10-CM | POA: Diagnosis not present

## 2023-02-12 LAB — CBC
HCT: 24.4 % — ABNORMAL LOW (ref 39.0–52.0)
Hemoglobin: 8.2 g/dL — ABNORMAL LOW (ref 13.0–17.0)
MCH: 30.4 pg (ref 26.0–34.0)
MCHC: 33.6 g/dL (ref 30.0–36.0)
MCV: 90.4 fL (ref 80.0–100.0)
Platelets: 159 10*3/uL (ref 150–400)
RBC: 2.7 MIL/uL — ABNORMAL LOW (ref 4.22–5.81)
RDW: 14.3 % (ref 11.5–15.5)
WBC: 8 10*3/uL (ref 4.0–10.5)
nRBC: 0 % (ref 0.0–0.2)

## 2023-02-12 MED ORDER — VITAMIN B-1 100 MG PO TABS: 100.0000 mg | ORAL_TABLET | Freq: Every day | ORAL | 0 refills | Status: AC

## 2023-02-12 MED ORDER — METHOCARBAMOL 500 MG PO TABS: 500.0000 mg | ORAL_TABLET | Freq: Four times a day (QID) | ORAL | 0 refills | Status: AC | PRN

## 2023-02-12 MED ORDER — FOLIC ACID 1 MG PO TABS: 1.0000 mg | ORAL_TABLET | Freq: Every day | ORAL | 0 refills | Status: AC

## 2023-02-12 NOTE — TOC Transition Note (Addendum)
Transition of Care [facility] - Discharge Note   Patient Details  Name: Spencer Berg MRN: [id number] Date of Birth: 06-04-1955  Transition of Care [facility]) CM/SW Contact:  Epifanio Lesches, RN Phone Number: 02/12/2023, 12:50 PM   Clinical Narrative:    Patient will DC to: home Anticipated DC date: 02/12/2023 Family notified: Yes Transport by: car         - s/p total right hip replacement 02/06/2023  Per MD patient ready for DC today . RN, patient, patient's family, and [facility] notified of DC. Pt without DME needs, already with RW and BSC @ home. Post hospital f/u noted on AVS. Pt without RX med concerns. Pt to pick up meds from local pharmacy. Family to provide transportation to home between 6-7 pm . RNCM will sign off for now as intervention is no longer needed. Please consult [place] again if new needs arise.     Barriers to Discharge: Continued Medical Work up   Patient Goals and CMS Choice Patient states their goals for this hospitalization and ongoing recovery are:: back to normal   Choice offered to / list presented to : Patient      Discharge Placement                       Discharge Plan and Services Additional resources added to the After Visit Summary for   In-house Referral: Clinical Social Work   Post Acute Care Choice: Home Health                    HH Arranged: PT, OT [facility] Agency: [facility] Date [facility] Agency Contacted: 02/09/23 Time HH Agency Contacted: 1238 Representative spoke with at [facility] Agency: Amy  Social Drivers of Health (SDOH) Interventions SDOH Screenings   Food Insecurity: No Food Insecurity (02/05/2023)  Housing: Low Risk  (02/12/2023)  Transportation Needs: No Transportation Needs (02/05/2023)  Utilities: Not At Risk (02/05/2023)  Tobacco Use: High Risk (02/06/2023)     Readmission Risk Interventions     No data to display

## 2023-02-12 NOTE — Plan of Care (Signed)

## 2023-02-12 NOTE — Discharge Summary (Signed)
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 168 hours. CBC: Recent Labs  Lab 02/05/23 2118 02/06/23 0543 02/08/23 0609 02/09/23 0447 02/10/23 0540 02/11/23 0520 02/12/23 0515  WBC 11.0*   < > 9.2 8.5 8.1 7.8 8.0  NEUTROABS 9.3*  --   --   --   --   --   --   HGB 12.2*   < > 6.6* 7.5* 6.7* 8.2* 8.2*  HCT 38.0*   < > 19.5* 21.8* 20.0* 24.0* 24.4*  MCV 88.8   < > 87.8 87.9 88.5 89.6 90.4  PLT 58*   < > 44* 46* 62* 109* 159   < > = values in this interval not displayed.   Cardiac Enzymes: Recent Labs  Lab 02/05/23 2118  CKTOTAL 513*   BNP: Invalid input(s): "POCBNP" CBG: No results for input(s): "GLUCAP" in the last 168 hours. D-Dimer No results for input(s): "DDIMER" in the last 72 hours. Hgb A1c No results for input(s): "HGBA1C" in the last 72 hours. Lipid Profile No results for input(s): "CHOL", "HDL", "LDLCALC", "TRIG", "CHOLHDL", "LDLDIRECT" in the last 72 hours. Thyroid function studies No  results for input(s): "TSH", "T4TOTAL", "T3FREE", "THYROIDAB" in the last 72 hours.  Invalid input(s): "FREET3" Anemia work up No results for input(s): "VITAMINB12", "FOLATE", "FERRITIN", "TIBC", "IRON", "RETICCTPCT" in the last 72 hours. Urinalysis    Component Value Date/Time   COLORURINE YELLOW 02/10/2023 1454   APPEARANCEUR CLEAR 02/10/2023 1454   LABSPEC 1.016 02/10/2023 1454   PHURINE 7.0 02/10/2023 1454   GLUCOSEU NEGATIVE 02/10/2023 1454   HGBUR NEGATIVE 02/10/2023 1454   BILIRUBINUR NEGATIVE 02/10/2023 1454   KETONESUR NEGATIVE 02/10/2023 1454   PROTEINUR NEGATIVE 02/10/2023 1454   NITRITE NEGATIVE 02/10/2023 1454   LEUKOCYTESUR NEGATIVE 02/10/2023 1454   Sepsis Labs Recent Labs  Lab 02/09/23 0447 02/10/23 0540 02/11/23 0520 02/12/23 0515  WBC 8.5 8.1 7.8 8.0   Microbiology Recent Results (from the past 240 hours)  Surgical pcr screen     Status: None   Collection Time: 02/05/23 10:05 PM   Specimen: Nasal Mucosa; Nasal Swab  Result Value Ref Range Status   MRSA, PCR NEGATIVE NEGATIVE Final   Staphylococcus aureus NEGATIVE NEGATIVE Final    Comment: (NOTE) The Xpert SA Assay (FDA approved for NASAL specimens in patients 101 years of age and older), is one component of a comprehensive surveillance program. It is not intended to diagnose infection nor to guide or monitor treatment. Performed at [facility], [address] [place], [place] [address]   Urine Culture     Status: None   Collection Time: 02/09/23 12:03 PM   Specimen: Urine, Clean Catch  Result Value Ref Range Status   Specimen Description URINE, CLEAN CATCH  Final   Special Requests NONE  Final   Culture   Final    NO GROWTH Performed at [facility], [address], [place], [place] [address]    Report Status 02/11/2023 FINAL  Final  Culture, blood (  Routine X 2) w Reflex to ID Panel     Status: None (Preliminary result)   Collection Time: 02/09/23 12:28 PM   Specimen: BLOOD  LEFT ARM  Result Value Ref Range Status   Specimen Description BLOOD LEFT ARM  Final   Special Requests   Final    BOTTLES DRAWN AEROBIC AND ANAEROBIC Blood Culture results may not be optimal due to an inadequate volume of blood received in culture bottles   Culture   Final    NO GROWTH 2 DAYS Performed at [facility], [address], [place], [place] [address]    Report Status PENDING  Incomplete  Culture, blood (Routine X 2) w Reflex to ID Panel     Status: None (Preliminary result)   Collection Time: 02/09/23 12:28 PM   Specimen: BLOOD RIGHT HAND  Result Value Ref Range Status   Specimen Description BLOOD RIGHT HAND  Final   Special Requests   Final    BOTTLES DRAWN AEROBIC AND ANAEROBIC Blood Culture results may not be optimal due to an inadequate volume of blood received in culture bottles   Culture   Final    NO GROWTH 2 DAYS Performed at [facility], [address], [place], [place] [address]    Report Status PENDING  Incomplete     Time coordinating discharge: Over 30 minutes  SIGNED:   Azucena Fallen, DO [facility] 02/12/2023, 7:35 AM Pager   If 7PM-7AM, please contact night-coverage [email or web address]

## 2023-02-14 LAB — CULTURE, BLOOD (ROUTINE X 2)
Culture: NO GROWTH
Culture: NO GROWTH

## 2023-02-23 NOTE — Telephone Encounter (Signed)
This encounter was created in error - please disregard.

## 2023-02-26 ENCOUNTER — Telehealth: Payer: Self-pay | Admitting: Family Medicine

## 2023-03-02 NOTE — Telephone Encounter (Signed)
Last refill by Dr. Natale Milch. Please advise

## 2023-03-03 NOTE — Telephone Encounter (Signed)
Left detailed voicemail advising patient rx will have to be provided by previous pcp until seen. CRM created. Ok for [facility] to advise if call is returned

## 2023-03-03 NOTE — Telephone Encounter (Signed)
 Patient has never been seen at this practice. Has appointment to establish with Dr. Roxan Hockey on 1-7. We cannot prescribe any medications until he establishes care here. Any refills need to be prescribed by the original prescribing physician.

## 2023-03-10 ENCOUNTER — Encounter: Payer: Self-pay | Admitting: Family Medicine

## 2023-03-10 ENCOUNTER — Ambulatory Visit: Payer: 59 | Admitting: Family Medicine

## 2023-03-10 VITALS — BP 126/87 | HR 83 | Ht 74.5 in | Wt 128.0 lb

## 2023-03-10 DIAGNOSIS — E559 Vitamin D deficiency, unspecified: Secondary | ICD-10-CM

## 2023-03-10 DIAGNOSIS — S72011D Unspecified intracapsular fracture of right femur, subsequent encounter for closed fracture with routine healing: Secondary | ICD-10-CM

## 2023-03-10 DIAGNOSIS — F1010 Alcohol abuse, uncomplicated: Secondary | ICD-10-CM | POA: Diagnosis not present

## 2023-03-10 DIAGNOSIS — Z96641 Presence of right artificial hip joint: Secondary | ICD-10-CM

## 2023-03-10 DIAGNOSIS — F17210 Nicotine dependence, cigarettes, uncomplicated: Secondary | ICD-10-CM

## 2023-03-10 DIAGNOSIS — E440 Moderate protein-calorie malnutrition: Secondary | ICD-10-CM

## 2023-03-10 DIAGNOSIS — N281 Cyst of kidney, acquired: Secondary | ICD-10-CM | POA: Diagnosis not present

## 2023-03-10 DIAGNOSIS — D696 Thrombocytopenia, unspecified: Secondary | ICD-10-CM | POA: Diagnosis not present

## 2023-03-10 DIAGNOSIS — Z1159 Encounter for screening for other viral diseases: Secondary | ICD-10-CM | POA: Diagnosis not present

## 2023-03-10 DIAGNOSIS — Z720 Tobacco use: Secondary | ICD-10-CM | POA: Insufficient documentation

## 2023-03-10 DIAGNOSIS — S72011A Unspecified intracapsular fracture of right femur, initial encounter for closed fracture: Secondary | ICD-10-CM

## 2023-03-10 NOTE — Assessment & Plan Note (Signed)
 Severe malnutrition with BMI of 16.21. Labs from December showed low sodium, calcium, albumin , protein, and alk phos. Facial and muscle wasting noted. Risks include weakened immune system and muscle atrophy; benefits of supplementation include improved health and strength. - Order complete metabolic panel - Continue folic acid  and multivitamin with vitamin D  - Order TSH, T4, and T3
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
## 2023-03-10 NOTE — Assessment & Plan Note (Signed)
 Chronic reports of Low platelet count with recent hospitalization requiring blood transfusion for likely postoperative anemia with hgb as low as 8.2 at the time of discharge  Last PLT was 159 on 02/19/23  Risks include increased bleeding and bruising; benefits of monitoring include early detection and management of complications. - Order CBC to check hemoglobin and platelet levels

## 2023-03-10 NOTE — Assessment & Plan Note (Signed)
 Chronic  Noted in discharge summary from 02/2023 hospitalization  Will obtain CMP for creatinine level follow up Consider additional imaging at future appt

## 2023-03-10 NOTE — Assessment & Plan Note (Signed)
 Chronic problem reviewed in patient's med hx  Patient denies heavy alcohol consumption  Reports drinking 2 beers 3 times per week  CMP ordered today  Recommended he continue folvite 1mg  daily and thiamine 100mg  daily along with MV

## 2023-03-10 NOTE — Assessment & Plan Note (Signed)
 Status post total hip arthroplasty on February 06, 2023, following a closed fracture of the neck of the right femur. Declined home health physical therapy, performing self-directed exercises. Risks include improper healing and prolonged recovery; benefits include maintaining independence and comfort. - Refer to orthopedics for post-op follow-up Patient appears to be healing well, states he is able to walk several miles per day unassisted

## 2023-03-11 LAB — CBC
Hematocrit: 37.3 % — ABNORMAL LOW (ref 37.5–51.0)
Hemoglobin: 11.9 g/dL — ABNORMAL LOW (ref 13.0–17.7)
MCH: 29.6 pg (ref 26.6–33.0)
MCHC: 31.9 g/dL (ref 31.5–35.7)
MCV: 93 fL (ref 79–97)
Platelets: 95 10*3/uL — CL (ref 150–450)
RBC: 4.02 x10E6/uL — ABNORMAL LOW (ref 4.14–5.80)
RDW: 13 % (ref 11.6–15.4)
WBC: 5.4 10*3/uL (ref 3.4–10.8)

## 2023-03-11 LAB — COMPREHENSIVE METABOLIC PANEL
ALT: 12 [IU]/L (ref 0–44)
AST: 13 [IU]/L (ref 0–40)
Albumin: 4.1 g/dL (ref 3.9–4.9)
Alkaline Phosphatase: 102 [IU]/L (ref 44–121)
BUN/Creatinine Ratio: 15 (ref 10–24)
BUN: 13 mg/dL (ref 8–27)
Bilirubin Total: 0.5 mg/dL (ref 0.0–1.2)
CO2: 25 mmol/L (ref 20–29)
Calcium: 9.4 mg/dL (ref 8.6–10.2)
Chloride: 103 mmol/L (ref 96–106)
Creatinine, Ser: 0.84 mg/dL (ref 0.76–1.27)
Globulin, Total: 2.9 g/dL (ref 1.5–4.5)
Glucose: 87 mg/dL (ref 70–99)
Potassium: 4.5 mmol/L (ref 3.5–5.2)
Sodium: 141 mmol/L (ref 134–144)
Total Protein: 7 g/dL (ref 6.0–8.5)
eGFR: 96 mL/min/{1.73_m2} (ref >59)

## 2023-03-11 LAB — HEMOGLOBIN A1C
Est. average glucose Bld gHb Est-mCnc: 100 mg/dL
Hgb A1c MFr Bld: 5.1 % (ref 4.8–5.6)

## 2023-03-11 LAB — HEPATITIS C ANTIBODY: Hep C Virus Ab: NONREACTIVE

## 2023-03-11 LAB — TSH+T4F+T3FREE
Free T4: 1.08 ng/dL (ref 0.82–1.77)
T3, Free: 2.7 pg/mL (ref 2.0–4.4)
TSH: 1.68 u[IU]/mL (ref 0.450–4.500)

## 2023-03-13 ENCOUNTER — Encounter: Payer: Self-pay | Admitting: Family Medicine

## 2023-11-24 ENCOUNTER — Other Ambulatory Visit

## 2023-11-26 ENCOUNTER — Other Ambulatory Visit

## 2023-12-15 ENCOUNTER — Other Ambulatory Visit

## 2023-12-17 ENCOUNTER — Other Ambulatory Visit

## 2024-01-04 NOTE — Progress Notes (Signed)
 KAIN MILOSEVIC                                          MRN: [id number]   01/04/2024   The [facility] Quality Team Specialist reviewed this patient medical record for the purposes of chart review for care gap closure. The following were reviewed: chart review for care gap closure-colorectal cancer screening.    [facility] Quality Team

## 2024-01-18 ENCOUNTER — Encounter: Payer: Self-pay | Admitting: Emergency Medicine
